# Patient Record
Sex: Female | Born: 1970
Health system: Southern US, Community
[De-identification: ages and names within clinical notes are randomized; demographics above are authoritative.]

## PROBLEM LIST (undated history)

## (undated) DIAGNOSIS — R400 Somnolence: Secondary | ICD-10-CM

## (undated) DIAGNOSIS — I272 Pulmonary hypertension, unspecified: Secondary | ICD-10-CM

## (undated) DIAGNOSIS — E669 Obesity, unspecified: Secondary | ICD-10-CM

## (undated) DIAGNOSIS — K219 Gastro-esophageal reflux disease without esophagitis: Secondary | ICD-10-CM

## (undated) DIAGNOSIS — J302 Other seasonal allergic rhinitis: Secondary | ICD-10-CM

## (undated) DIAGNOSIS — G4733 Obstructive sleep apnea (adult) (pediatric): Secondary | ICD-10-CM

## (undated) DIAGNOSIS — R5383 Other fatigue: Secondary | ICD-10-CM

## (undated) HISTORY — DX: Pulmonary hypertension, unspecified: I27.20

## (undated) HISTORY — DX: Gastro-esophageal reflux disease without esophagitis: K21.9

## (undated) HISTORY — PX: OVARIAN CYST SURGERY: SHX726

## (undated) HISTORY — DX: Somnolence: R40.0

## (undated) HISTORY — DX: Obesity, unspecified: E66.9

## (undated) HISTORY — DX: Other seasonal allergic rhinitis: J30.2

## (undated) HISTORY — DX: Obstructive sleep apnea (adult) (pediatric): G47.33

## (undated) HISTORY — DX: Other fatigue: R53.83

---

## 2001-01-27 ENCOUNTER — Emergency Department (HOSPITAL_COMMUNITY): Admission: EM | Admit: 2001-01-27 | Discharge: 2001-01-27 | Payer: Self-pay | Admitting: Emergency Medicine

## 2003-09-12 ENCOUNTER — Emergency Department (HOSPITAL_COMMUNITY): Admission: EM | Admit: 2003-09-12 | Discharge: 2003-09-12 | Payer: Self-pay | Admitting: Emergency Medicine

## 2008-11-22 ENCOUNTER — Encounter: Payer: Self-pay | Admitting: Internal Medicine

## 2008-12-28 ENCOUNTER — Ambulatory Visit: Payer: Self-pay | Admitting: Internal Medicine

## 2008-12-28 DIAGNOSIS — R1011 Right upper quadrant pain: Secondary | ICD-10-CM | POA: Insufficient documentation

## 2008-12-28 DIAGNOSIS — K219 Gastro-esophageal reflux disease without esophagitis: Secondary | ICD-10-CM | POA: Insufficient documentation

## 2008-12-28 DIAGNOSIS — R1319 Other dysphagia: Secondary | ICD-10-CM | POA: Insufficient documentation

## 2009-01-28 ENCOUNTER — Encounter: Payer: Self-pay | Admitting: Internal Medicine

## 2009-01-28 ENCOUNTER — Ambulatory Visit: Payer: Self-pay | Admitting: Internal Medicine

## 2009-01-28 HISTORY — PX: UPPER GASTROINTESTINAL ENDOSCOPY: SHX188

## 2009-03-14 ENCOUNTER — Telehealth: Payer: Self-pay | Admitting: Internal Medicine

## 2010-10-03 ENCOUNTER — Encounter: Payer: Self-pay | Admitting: Internal Medicine

## 2010-10-03 ENCOUNTER — Ambulatory Visit (INDEPENDENT_AMBULATORY_CARE_PROVIDER_SITE_OTHER): Payer: 59 | Admitting: Internal Medicine

## 2010-10-03 ENCOUNTER — Other Ambulatory Visit (INDEPENDENT_AMBULATORY_CARE_PROVIDER_SITE_OTHER): Payer: 59

## 2010-10-03 VITALS — BP 138/72 | HR 88 | Ht 65.5 in | Wt 207.8 lb

## 2010-10-03 DIAGNOSIS — R109 Unspecified abdominal pain: Secondary | ICD-10-CM

## 2010-10-03 DIAGNOSIS — R1010 Upper abdominal pain, unspecified: Secondary | ICD-10-CM

## 2010-10-03 DIAGNOSIS — R198 Other specified symptoms and signs involving the digestive system and abdomen: Secondary | ICD-10-CM

## 2010-10-03 LAB — CBC WITH DIFFERENTIAL/PLATELET
Basophils Absolute: 0.1 10*3/uL (ref 0.0–0.1)
Eosinophils Absolute: 0.1 10*3/uL (ref 0.0–0.7)
Lymphocytes Relative: 31 % (ref 12.0–46.0)
MCHC: 34.7 g/dL (ref 30.0–36.0)
Monocytes Relative: 8 % (ref 3.0–12.0)
Neutro Abs: 3.5 10*3/uL (ref 1.4–7.7)
Neutrophils Relative %: 57.9 % (ref 43.0–77.0)
Platelets: 247 10*3/uL (ref 150.0–400.0)
RDW: 12.7 % (ref 11.5–14.6)

## 2010-10-03 MED ORDER — ALIGN 4 MG PO CAPS: 1.0000 | ORAL_CAPSULE | Freq: Every day | ORAL | Status: AC

## 2010-10-03 NOTE — Patient Instructions (Addendum)
We are giving you samples of Align to take for 4 weeks You will follow up as needed unless you continue to have problems then you will need to contact the office Cc. Dr Konrad Felix

## 2010-10-03 NOTE — Progress Notes (Signed)
Subjective:    Patient ID: Brittany [name], female    DOB: [birth year], 40 y.o.   MRN: [id number]  HPI Comments: 40 yo white woman seen last in 2010. EGD then with minimal esophagitis, gastritis and duodenitis. In the early winter she noted some changes. Sudden intermittent cramps and diarrhea began a few months ago and a constant fullness and bloated sensation in upper abdomen. Then felt a lump in upper abdomen, now "just a hardness". Is tender in abdomen. Is concerned that there may be something serious wrong. Chronic intermittent nausea x years not changed. Bowel symptoms are less ut not gone. She is having irregular and alternating bowel habits. ? If dairy products bothering her. Has reduced but not eliminated dairy. Works third shift and does not sleep well overall. These symptoms do not bother her sleep.  No melena or passage of blood. + globus sensation but this is helped by eating small meals. She began Advil PM and also uses advil regularly. She stopped 2 months ago after a year of that but no changes.  Gastrophageal Reflux She complains of abdominal pain.  Abdominal Pain Her past medical history is significant for GERD.      Review of Systems  Gastrointestinal: Positive for abdominal pain.  Sleeping problems. Denies urinary problems, menses regular      Objective:   Physical Exam  Constitutional: She is oriented to person, place, and time. She appears well-developed and well-nourished.       Overweight  Eyes: Conjunctivae are normal. No scleral icterus.  Cardiovascular: Normal rate, regular rhythm and normal heart sounds.  Exam reveals no gallop.   Pulmonary/Chest: Effort normal and breath sounds normal. She has no wheezes. She has no rales.  Abdominal: Soft. Bowel sounds are normal. She exhibits no distension and no mass. There is tenderness. There is no rebound and no guarding.       Mild diffuse tenderness The linea alba is palpable in the midline and this is demonstrated to  be a hard areas she palpated, it is more prominent with muscle tension during cough or flexion of the abdominal wall No hernias or no hepatosplenomegaly or mass  Neurological: She is alert and oriented to person, place, and time.  Psychiatric: She has a normal mood and affect.          Assessment & Plan:  #1 upper abdominal pain more so than lower this seems like an irritable bowel phenomenon more than anything to me. Her abdominal exam is benign and the firm areas appear to be connected tissue and the abdominal wall. We discussed this and we'll order lab workup with CBC, cemented TSH and sedimentation rate. We'll forward that to Dr. Konrad Felix when complete. Unless that shows anything different she will complete a trial of Align therapy for one month which will hopefully help her altered bowel habits as well.  #2 Altered bowel habits with alternating loose urgent stools and some constipation this is most consistent with IBS in my opinion she will try the align it may need to add some fiber depending upon what is seen in how she responds  #3 globus sensation and some GERD symptoms, these are stable previous upper endoscopy was unrevealing  She'll followup as needed and workup will be directed based upon persistent or new symptoms or signs and if the labs are abnormal. We talked about a more aggressive evaluation at this time but things seem to be improving overall and she was reassured today but knows to  return or call back if things fail to improve.

## 2010-10-04 ENCOUNTER — Telehealth: Payer: Self-pay

## 2010-10-04 LAB — COMPREHENSIVE METABOLIC PANEL
ALT: 20 U/L (ref 0–35)
AST: 19 U/L (ref 0–37)
CO2: 25 mEq/L (ref 19–32)
Calcium: 9 mg/dL (ref 8.4–10.5)
Chloride: 106 mEq/L (ref 96–112)
Creatinine, Ser: 0.7 mg/dL (ref 0.4–1.2)
GFR: 99.61 mL/min (ref >60.00)
Potassium: 4.9 mEq/L (ref 3.5–5.1)
Sodium: 140 mEq/L (ref 135–145)
Total Protein: 6.7 g/dL (ref 6.0–8.3)

## 2010-10-04 LAB — TSH: TSH: 3.73 u[IU]/mL (ref 0.35–5.50)

## 2010-10-04 NOTE — Telephone Encounter (Signed)
Spoke with pt and she is aware of labs per Dr. Leone Payor and his recommendation. Labs faxed to Dr. Konrad Felix

## 2010-10-04 NOTE — Telephone Encounter (Signed)
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 Konrad Felix - her PCP. I was unable to route/fax it in Epic

## 2010-10-04 NOTE — Progress Notes (Signed)
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 Konrad Felix - her PCP. I was unable to route/fax it in Epic ______

## 2013-04-17 ENCOUNTER — Emergency Department (HOSPITAL_COMMUNITY)
Admission: EM | Admit: 2013-04-17 | Discharge: 2013-04-17 | Disposition: A | Discharge status: Home / Self Care | Payer: No Typology Code available for payment source | Attending: Emergency Medicine | Admitting: Emergency Medicine

## 2013-04-17 ENCOUNTER — Encounter (HOSPITAL_COMMUNITY): Payer: Self-pay | Admitting: Emergency Medicine

## 2013-04-17 DIAGNOSIS — Y9389 Activity, other specified: Secondary | ICD-10-CM | POA: Insufficient documentation

## 2013-04-17 DIAGNOSIS — J309 Allergic rhinitis, unspecified: Secondary | ICD-10-CM | POA: Insufficient documentation

## 2013-04-17 DIAGNOSIS — K219 Gastro-esophageal reflux disease without esophagitis: Secondary | ICD-10-CM | POA: Insufficient documentation

## 2013-04-17 DIAGNOSIS — Z79899 Other long term (current) drug therapy: Secondary | ICD-10-CM | POA: Insufficient documentation

## 2013-04-17 DIAGNOSIS — Y92410 Unspecified street and highway as the place of occurrence of the external cause: Secondary | ICD-10-CM | POA: Insufficient documentation

## 2013-04-17 DIAGNOSIS — M542 Cervicalgia: Secondary | ICD-10-CM

## 2013-04-17 MED ORDER — IBUPROFEN 800 MG PO TABS: 800.0000 mg | ORAL_TABLET | Freq: Three times a day (TID) | ORAL | Status: DC

## 2013-04-17 MED ORDER — DIAZEPAM 5 MG PO TABS: 5.0000 mg | ORAL_TABLET | Freq: Two times a day (BID) | ORAL | Status: DC

## 2013-04-17 NOTE — ED Notes (Signed)
PA at bedside for evaluation

## 2013-04-17 NOTE — ED Provider Notes (Signed)
Medical screening examination/treatment/procedure(s) were performed by non-physician practitioner and as supervising physician I was immediately available for consultation/collaboration.   Darleene Cumpian, MD 04/17/13 1648 
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
## 2013-05-18 ENCOUNTER — Emergency Department (HOSPITAL_COMMUNITY)
Admission: EM | Admit: 2013-05-18 | Discharge: 2013-05-18 | Disposition: A | Discharge status: Home / Self Care | Payer: No Typology Code available for payment source | Attending: Emergency Medicine | Admitting: Emergency Medicine

## 2013-05-18 ENCOUNTER — Emergency Department (HOSPITAL_COMMUNITY): Payer: No Typology Code available for payment source

## 2013-05-18 ENCOUNTER — Encounter (HOSPITAL_COMMUNITY): Payer: Self-pay | Admitting: Emergency Medicine

## 2013-05-18 DIAGNOSIS — S0003XA Contusion of scalp, initial encounter: Secondary | ICD-10-CM | POA: Insufficient documentation

## 2013-05-18 DIAGNOSIS — M545 Low back pain, unspecified: Secondary | ICD-10-CM | POA: Insufficient documentation

## 2013-05-18 DIAGNOSIS — Y92410 Unspecified street and highway as the place of occurrence of the external cause: Secondary | ICD-10-CM | POA: Insufficient documentation

## 2013-05-18 DIAGNOSIS — Y9389 Activity, other specified: Secondary | ICD-10-CM | POA: Insufficient documentation

## 2013-05-18 DIAGNOSIS — J309 Allergic rhinitis, unspecified: Secondary | ICD-10-CM | POA: Insufficient documentation

## 2013-05-18 DIAGNOSIS — M542 Cervicalgia: Secondary | ICD-10-CM | POA: Insufficient documentation

## 2013-05-18 DIAGNOSIS — K219 Gastro-esophageal reflux disease without esophagitis: Secondary | ICD-10-CM | POA: Insufficient documentation

## 2013-05-18 DIAGNOSIS — R1031 Right lower quadrant pain: Secondary | ICD-10-CM | POA: Insufficient documentation

## 2013-05-18 DIAGNOSIS — Z79899 Other long term (current) drug therapy: Secondary | ICD-10-CM | POA: Insufficient documentation

## 2013-05-18 LAB — POCT I-STAT, CHEM 8
Calcium, Ion: 1.18 mmol/L (ref 1.12–1.23)
Creatinine, Ser: 0.8 mg/dL (ref 0.50–1.10)
Glucose, Bld: 107 mg/dL — ABNORMAL HIGH (ref 70–99)
Hemoglobin: 14.3 g/dL (ref 12.0–15.0)
Potassium: 3.9 mEq/L (ref 3.5–5.1)

## 2013-05-18 MED ORDER — CYCLOBENZAPRINE HCL 10 MG PO TABS
5.0000 mg | ORAL_TABLET | Freq: Once | ORAL | Status: AC
  Administered 2013-05-18: 5 mg via ORAL
  Filled 2013-05-18: qty 1

## 2013-05-18 MED ORDER — IBUPROFEN 400 MG PO TABS: 400.0000 mg | ORAL_TABLET | Freq: Four times a day (QID) | ORAL | Status: DC | PRN

## 2013-05-18 MED ORDER — HYDROCODONE-ACETAMINOPHEN 5-325 MG PO TABS: 2.0000 | ORAL_TABLET | ORAL | Status: DC | PRN

## 2013-05-18 MED ORDER — HYDROCODONE-ACETAMINOPHEN 5-325 MG PO TABS
1.0000 | ORAL_TABLET | Freq: Once | ORAL | Status: AC
  Administered 2013-05-18: 1 via ORAL
  Filled 2013-05-18: qty 1

## 2013-05-18 MED ORDER — DIAZEPAM 5 MG PO TABS: 5.0000 mg | ORAL_TABLET | Freq: Four times a day (QID) | ORAL | Status: DC | PRN

## 2013-05-18 MED ORDER — IOHEXOL 300 MG/ML  SOLN
100.0000 mL | Freq: Once | INTRAMUSCULAR | Status: AC | PRN
  Administered 2013-05-18: 100 mL via INTRAVENOUS
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
## 2013-05-19 NOTE — ED Provider Notes (Signed)
Medical screening examination/treatment/procedure(s) were performed by non-physician practitioner and as supervising physician I was immediately available for consultation/collaboration.  EKG Interpretation   None        Teddrick Mallari R. Ayame Rena, MD 05/19/13 0658 

## 2013-08-14 ENCOUNTER — Emergency Department (HOSPITAL_COMMUNITY)
Admission: EM | Admit: 2013-08-14 | Discharge: 2013-08-14 | Disposition: A | Discharge status: Home / Self Care | Payer: 59 | Source: Home / Self Care | Attending: Family Medicine | Admitting: Family Medicine

## 2013-08-14 ENCOUNTER — Encounter (HOSPITAL_COMMUNITY): Payer: Self-pay | Admitting: Emergency Medicine

## 2013-08-14 DIAGNOSIS — L738 Other specified follicular disorders: Secondary | ICD-10-CM

## 2013-08-14 DIAGNOSIS — L739 Follicular disorder, unspecified: Secondary | ICD-10-CM

## 2013-08-14 MED ORDER — FLUCONAZOLE 150 MG PO TABS: 150.0000 mg | ORAL_TABLET | Freq: Once | ORAL | Status: DC

## 2013-08-14 MED ORDER — DOXYCYCLINE HYCLATE 100 MG PO CAPS: 100.0000 mg | ORAL_CAPSULE | Freq: Two times a day (BID) | ORAL | Status: DC

## 2013-08-14 NOTE — Discharge Instructions (Signed)
Thank you for coming in today. Take doxycycline twice daily for 7-10 days.  Use fluconazole if you develop a yeast infection.  Return if not getting better or if the central portion becomes soft and squishy and very tender.   Folliculitis  Folliculitis is redness, soreness, and swelling (inflammation) of the hair follicles. This condition can occur anywhere on the body. People with weakened immune systems, diabetes, or obesity have a greater risk of getting folliculitis. CAUSES  Bacterial infection. This is the most common cause.  Fungal infection.  Viral infection.  Contact with certain chemicals, especially oils and tars. Long-term folliculitis can result from bacteria that live in the nostrils. The bacteria may trigger multiple outbreaks of folliculitis over time. SYMPTOMS Folliculitis most commonly occurs on the scalp, thighs, legs, back, buttocks, and areas where hair is shaved frequently. An early sign of folliculitis is a small, white or yellow, pus-filled, itchy lesion (pustule). These lesions appear on a red, inflamed follicle. They are usually less than 0.2 inches (5 mm) wide. When there is an infection of the follicle that goes deeper, it becomes a boil or furuncle. A group of closely packed boils creates a larger lesion (carbuncle). Carbuncles tend to occur in hairy, sweaty areas of the body. DIAGNOSIS  Your caregiver can usually tell what is wrong by doing a physical exam. A sample may be taken from one of the lesions and tested in a lab. This can help determine what is causing your folliculitis. TREATMENT  Treatment may include:  Applying warm compresses to the affected areas.  Taking antibiotic medicines orally or applying them to the skin.  Draining the lesions if they contain a large amount of pus or fluid.  Laser hair removal for cases of long-lasting folliculitis. This helps to prevent regrowth of the hair. HOME CARE INSTRUCTIONS  Apply warm compresses to the  affected areas as directed by your caregiver.  If antibiotics are prescribed, take them as directed. Finish them even if you start to feel better.  You may take over-the-counter medicines to relieve itching.  Do not shave irritated skin.  Follow up with your caregiver as directed. SEEK IMMEDIATE MEDICAL CARE IF:   You have increasing redness, swelling, or pain in the affected area.  You have a fever. MAKE SURE YOU:  Understand these instructions.  Will watch your condition.  Will get help right away if you are not doing well or get worse. Document Released: 08/27/2001 Document Revised: 12/18/2011 Document Reviewed: 09/18/2011 [facility] Patient Information 2014 [place], [place].  Abscess An abscess is an infected area that contains a collection of pus and debris.It can occur in almost any part of the body. An abscess is also known as a furuncle or boil. CAUSES  An abscess occurs when tissue gets infected. This can occur from blockage of oil or sweat glands, infection of hair follicles, or a minor injury to the skin. As the body tries to fight the infection, pus collects in the area and creates pressure under the skin. This pressure causes pain. People with weakened immune systems have difficulty fighting infections and get certain abscesses more often.  SYMPTOMS Usually an abscess develops on the skin and becomes a painful mass that is red, warm, and tender. If the abscess forms under the skin, you may feel a moveable soft area under the skin. Some abscesses break open (rupture) on their own, but most will continue to get worse without care. The infection can spread deeper into the body and eventually into  the bloodstream, causing you to feel ill.  DIAGNOSIS  Your caregiver will take your medical history and perform a physical exam. A sample of fluid may also be taken from the abscess to determine what is causing your infection. TREATMENT  Your caregiver may prescribe antibiotic  medicines to fight the infection. However, taking antibiotics alone usually does not cure an abscess. Your caregiver may need to make a small cut (incision) in the abscess to drain the pus. In some cases, gauze is packed into the abscess to reduce pain and to continue draining the area. HOME CARE INSTRUCTIONS   Only take over-the-counter or prescription medicines for pain, discomfort, or fever as directed by your caregiver.  If you were prescribed antibiotics, take them as directed. Finish them even if you start to feel better.  If gauze is used, follow your caregiver's directions for changing the gauze.  To avoid spreading the infection:  Keep your draining abscess covered with a bandage.  Wash your hands well.  Do not share personal care items, towels, or whirlpools with others.  Avoid skin contact with others.  Keep your skin and clothes clean around the abscess.  Keep all follow-up appointments as directed by your caregiver. SEEK MEDICAL CARE IF:   You have increased pain, swelling, redness, fluid drainage, or bleeding.  You have muscle aches, chills, or a general ill feeling.  You have a fever. MAKE SURE YOU:   Understand these instructions.  Will watch your condition.  Will get help right away if you are not doing well or get worse. Document Released: 03/28/2005 Document Revised: 12/18/2011 Document Reviewed: 08/31/2011 [facility] Patient Information 2014 [place], [place].

## 2013-08-14 NOTE — ED Provider Notes (Signed)
Brittany [name] is a 43 y.o. female who presents to Urgent Care today for spider bite. Patient has developed a painful erythematous papule on her right superior anterior chest wall. She cannot recall any actual bug bite. It has become red and tender. She has not tried any medications. No fevers chills nausea vomiting or diarrhea. She notes that she typically gets yeast infections following antibiotics. Feels well otherwise.   Past Medical History  Diagnosis Date  . GERD (gastroesophageal reflux disease)   . Seasonal allergies    History  Substance Use Topics  . Smoking status: Never Smoker   . Smokeless tobacco: Never Used  . Alcohol Use: Yes     Comment: wine 1 glass ervey 2 months   ROS as above Medications: No current facility-administered medications for this encounter.   Current Outpatient Prescriptions  Medication Sig Dispense Refill  . cetirizine (ZYRTEC) 10 MG tablet Take 10 mg by mouth at bedtime.       . diazepam (VALIUM) 5 MG tablet Take 1 tablet (5 mg total) by mouth every 6 (six) hours as needed for anxiety.  20 tablet  0  . doxycycline (VIBRAMYCIN) 100 MG capsule Take 1 capsule (100 mg total) by mouth 2 (two) times daily.  20 capsule  0  . esomeprazole (NEXIUM) 20 MG capsule Take 20 mg by mouth every morning.      . fexofenadine (ALLEGRA) 180 MG tablet Take 180 mg by mouth daily as needed for allergies.       . fluconazole (DIFLUCAN) 150 MG tablet Take 1 tablet (150 mg total) by mouth once.  1 tablet  1  . GLUCOSAMINE-CHONDROITIN PO Take 1 tablet by mouth every morning.      Marland Kitchen. HYDROcodone-acetaminophen (NORCO/VICODIN) 5-325 MG per tablet Take 2 tablets by mouth every 4 (four) hours as needed.  25 tablet  0  . ibuprofen (ADVIL,MOTRIN) 400 MG tablet Take 1 tablet (400 mg total) by mouth every 6 (six) hours as needed.  30 tablet  0  . norethindrone-ethinyl estradiol (ORTHO-NOVUM 1-35 TAB,NORTREL 1-35 TAB) 1-35 MG-MCG per tablet Take 1 tablet by mouth every morning.          Exam:  BP 132/92  Pulse 108  Temp(Src) 98.7 F (37.1 C) (Oral)  Resp 18  SpO2 99%  LMP 07/24/2013  Gen: Well NAD SKIN: Erythematous indurated papule right anterior superior chest wall. Tender to touch. Total diameter of erythema about 1.5 cm.    Assessment and Plan: 43 y.o. female with folliculitis. Not yet fluctuant.  Plan to treat with oral antibiotics. Will use doxycycline. Prescribe fluconazole in case patient develops yeast infection.  Discussed warning signs or symptoms. Please see discharge instructions. Patient expresses understanding.    Rodolph BongEvan S Amelia Burgard, MD 08/14/13 [phone]

## 2013-08-14 NOTE — ED Notes (Signed)
See physicians note  Pt c/o poss insect bite to right shoulder onset 3 days Sxs include pain and tenderness Alert w/no signs of acute distress.

## 2014-11-15 ENCOUNTER — Emergency Department (INDEPENDENT_AMBULATORY_CARE_PROVIDER_SITE_OTHER)
Admission: EM | Admit: 2014-11-15 | Discharge: 2014-11-15 | Disposition: A | Discharge status: Home / Self Care | Payer: 59 | Source: Home / Self Care | Attending: Family Medicine | Admitting: Family Medicine

## 2014-11-15 DIAGNOSIS — J40 Bronchitis, not specified as acute or chronic: Secondary | ICD-10-CM | POA: Diagnosis not present

## 2014-11-15 MED ORDER — PREDNISONE 10 MG PO TABS: 30.0000 mg | ORAL_TABLET | Freq: Every day | ORAL | Status: DC

## 2014-11-15 MED ORDER — AZITHROMYCIN 250 MG PO TABS: 250.0000 mg | ORAL_TABLET | Freq: Every day | ORAL | Status: DC

## 2014-11-15 MED ORDER — HYDROCOD POLST-CPM POLST ER 10-8 MG/5ML PO SUER: 5.0000 mL | Freq: Two times a day (BID) | ORAL | Status: DC | PRN

## 2014-11-15 NOTE — Discharge Instructions (Signed)
Thank you for coming in today. °Call or go to the emergency room if you get worse, have trouble breathing, have chest pains, or palpitations.  ° °Acute Bronchitis °Bronchitis is inflammation of the airways that extend from the windpipe into the lungs (bronchi). The inflammation often causes mucus to develop. This leads to a cough, which is the most common symptom of bronchitis.  °In acute bronchitis, the condition usually develops suddenly and goes away over time, usually in a couple weeks. Smoking, allergies, and asthma can make bronchitis worse. Repeated episodes of bronchitis may cause further lung problems.  °CAUSES °Acute bronchitis is most often caused by the same virus that causes a cold. The virus can spread from person to person (contagious) through coughing, sneezing, and touching contaminated objects. °SIGNS AND SYMPTOMS  °· Cough.   °· Fever.   °· Coughing up mucus.   °· Body aches.   °· Chest congestion.   °· Chills.   °· Shortness of breath.   °· Sore throat.   °DIAGNOSIS  °Acute bronchitis is usually diagnosed through a physical exam. Your health care provider will also ask you questions about your medical history. Tests, such as chest X-rays, are sometimes done to rule out other conditions.  °TREATMENT  °Acute bronchitis usually goes away in a couple weeks. Oftentimes, no medical treatment is necessary. Medicines are sometimes given for relief of fever or cough. Antibiotic medicines are usually not needed but may be prescribed in certain situations. In some cases, an inhaler may be recommended to help reduce shortness of breath and control the cough. A cool mist vaporizer may also be used to help thin bronchial secretions and make it easier to clear the chest.  °HOME CARE INSTRUCTIONS °· Get plenty of rest.   °· Drink enough fluids to keep your urine clear or pale yellow (unless you have a medical condition that requires fluid restriction). Increasing fluids may help thin your respiratory secretions  (sputum) and reduce chest congestion, and it will prevent dehydration.   °· Take medicines only as directed by your health care provider. °· If you were prescribed an antibiotic medicine, finish it all even if you start to feel better. °· Avoid smoking and secondhand smoke. Exposure to cigarette smoke or irritating chemicals will make bronchitis worse. If you are a smoker, consider using nicotine gum or skin patches to help control withdrawal symptoms. Quitting smoking will help your lungs heal faster.   °· Reduce the chances of another bout of acute bronchitis by washing your hands frequently, avoiding people with cold symptoms, and trying not to touch your hands to your mouth, nose, or eyes.   °· Keep all follow-up visits as directed by your health care provider.   °SEEK MEDICAL CARE IF: °Your symptoms do not improve after 1 week of treatment.  °SEEK IMMEDIATE MEDICAL CARE IF: °· You develop an increased fever or chills.   °· You have chest pain.   °· You have severe shortness of breath. °· You have bloody sputum.   °· You develop dehydration. °· You faint or repeatedly feel like you are going to pass out. °· You develop repeated vomiting. °· You develop a severe headache. °MAKE SURE YOU:  °· Understand these instructions. °· Will watch your condition. °· Will get help right away if you are not doing well or get worse. °Document Released: 07/26/2004 Document Revised: 11/02/2013 Document Reviewed: 12/09/2012 °Exit[employer]® Patient Information ©2015 [employer]. This information is not intended to replace advice given to you by your health care provider. Make sure you discuss any questions you have with your   health care provider. ° °

## 2014-11-15 NOTE — ED Notes (Signed)
C/o cough States she has drainage in her throat States she has a productive cough with yellow mucous Patient is nausea Cold meds used as tx

## 2014-11-15 NOTE — ED Provider Notes (Signed)
Brittany [name] is a 44 y.o. female who presents to Urgent Care today for Cough postnasal drip ear pressure. Symptoms present for several days. Symptoms are consistent with prior episodes of bronchitis. No fevers or chills vomiting or diarrhea. She took equal today which helped a little. She's tried some Flonase in the past but has trouble tolerating nasal sprays. She's had this multiple times in the past and done well with antibiotics.   Past Medical History  Diagnosis Date  . GERD (gastroesophageal reflux disease)   . Seasonal allergies    Past Surgical History  Procedure Laterality Date  . Ovarian cyst surgery    . Upper gastrointestinal endoscopy  01/28/2009    minimal esophagitis - GERD, chronic peptic duodenitis, minimal gastritis   History  Substance Use Topics  . Smoking status: Never Smoker   . Smokeless tobacco: Never Used  . Alcohol Use: Yes     Comment: wine 1 glass ervey 2 months   ROS as above Medications: No current facility-administered medications for this encounter.   Current Outpatient Prescriptions  Medication Sig Dispense Refill  . azithromycin (ZITHROMAX) 250 MG tablet Take 1 tablet (250 mg total) by mouth daily. Take first 2 tablets together, then 1 every day until finished. 6 tablet 0  . cetirizine (ZYRTEC) 10 MG tablet Take 10 mg by mouth at bedtime.     . chlorpheniramine-HYDROcodone (TUSSIONEX PENNKINETIC ER) 10-8 MG/5ML SUER Take 5 mLs by mouth every 12 (twelve) hours as needed for cough. 140 mL 0  . diazepam (VALIUM) 5 MG tablet Take 1 tablet (5 mg total) by mouth every 6 (six) hours as needed for anxiety. 20 tablet 0  . doxycycline (VIBRAMYCIN) 100 MG capsule Take 1 capsule (100 mg total) by mouth 2 (two) times daily. 20 capsule 0  . esomeprazole (NEXIUM) 20 MG capsule Take 20 mg by mouth every morning.    . fexofenadine (ALLEGRA) 180 MG tablet Take 180 mg by mouth daily as needed for allergies.     . fluconazole (DIFLUCAN) 150 MG tablet Take 1 tablet  (150 mg total) by mouth once. 1 tablet 1  . GLUCOSAMINE-CHONDROITIN PO Take 1 tablet by mouth every morning.    Marland Kitchen. ibuprofen (ADVIL,MOTRIN) 400 MG tablet Take 1 tablet (400 mg total) by mouth every 6 (six) hours as needed. 30 tablet 0  . norethindrone-ethinyl estradiol (ORTHO-NOVUM 1-35 TAB,NORTREL 1-35 TAB) 1-35 MG-MCG per tablet Take 1 tablet by mouth every morning.     . predniSONE (DELTASONE) 10 MG tablet Take 3 tablets (30 mg total) by mouth daily. 15 tablet 0   No Known Allergies   Exam:  BP 149/76 mmHg  Pulse 117  Temp(Src) 99.4 F (37.4 C) (Oral)  Resp 16  SpO2 98%  LMP 11/09/2014 (Within Weeks) Gen: Well NAD HEENT: EOMI,  MMM posterior pharynx with cobblestoning normal tympanic membranes bilaterally Lungs: Normal work of breathing. CTABL Heart: tachycardia no MRG Abd: NABS, Soft. Nondistended, Nontender Exts: Brisk capillary refill, warm and well perfused.   No results found for this or any previous visit (from the past 24 hour(s)). No results found.  Assessment and Plan: 44 y.o. female with bronchitis with postnasal drip. Treat with prednisone and Tussionex. He is azithromycin if not better. Tachycardia related to DayQuil. Return as needed  Discussed warning signs or symptoms. Please see discharge instructions. Patient expresses understanding.     Rodolph BongEvan S Atia Haupt, MD 11/15/14 [phone]

## 2014-11-16 ENCOUNTER — Encounter: Payer: Self-pay | Admitting: Internal Medicine
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
## 2015-07-05 MED FILL — VENLAFAXINE HCL ER 75 MG CA: 75 | 30 days supply | Qty: 30 | Fill #0

## 2015-07-21 DIAGNOSIS — G2581 Restless legs syndrome: Secondary | ICD-10-CM | POA: Diagnosis not present

## 2015-07-21 DIAGNOSIS — R413 Other amnesia: Secondary | ICD-10-CM | POA: Diagnosis not present

## 2015-07-21 DIAGNOSIS — G44209 Tension-type headache, unspecified, not intractable: Secondary | ICD-10-CM | POA: Diagnosis not present

## 2015-07-21 DIAGNOSIS — K210 Gastro-esophageal reflux disease with esophagitis: Secondary | ICD-10-CM | POA: Diagnosis not present

## 2015-07-21 DIAGNOSIS — F5101 Primary insomnia: Secondary | ICD-10-CM | POA: Diagnosis not present

## 2015-07-21 DIAGNOSIS — F3342 Major depressive disorder, recurrent, in full remission: Secondary | ICD-10-CM | POA: Diagnosis not present

## 2015-07-21 MED FILL — AMITRIPTYLINE HCL 50 MG TAB: 50 | 30 days supply | Qty: 30 | Fill #0

## 2015-07-21 MED FILL — ESOMEPRAZOLE MAG DR 40 MG C: 40 | 30 days supply | Qty: 30 | Fill #0

## 2015-08-04 MED FILL — VENLAFAXINE HCL ER 75 MG CA: 75 | 30 days supply | Qty: 30 | Fill #0

## 2015-08-22 MED FILL — ESOMEPRAZOLE MAG DR 40 MG C: 40 | 30 days supply | Qty: 30 | Fill #1

## 2015-09-05 MED FILL — VENLAFAXINE HCL ER 75 MG CA: 75 | 30 days supply | Qty: 30 | Fill #1

## 2015-09-14 MED FILL — AMITRIPTYLINE HCL 50 MG TAB: 50 | 30 days supply | Qty: 30 | Fill #1

## 2015-09-21 MED FILL — ESOMEPRAZOLE MAG DR 40 MG C: 40 | 30 days supply | Qty: 30 | Fill #2

## 2015-10-07 MED FILL — VENLAFAXINE HCL ER 75 MG CA: 75 | 30 days supply | Qty: 30 | Fill #2

## 2015-10-08 DIAGNOSIS — R05 Cough: Secondary | ICD-10-CM | POA: Diagnosis not present

## 2015-10-08 DIAGNOSIS — J019 Acute sinusitis, unspecified: Secondary | ICD-10-CM | POA: Diagnosis not present

## 2015-10-12 DIAGNOSIS — Z029 Encounter for administrative examinations, unspecified: Secondary | ICD-10-CM | POA: Diagnosis not present

## 2015-10-18 MED FILL — AMITRIPTYLINE HCL 50 MG TAB: 50 | 30 days supply | Qty: 30 | Fill #2

## 2015-11-07 MED FILL — ESOMEPRAZOLE MAG DR 40 MG C: 40 | 30 days supply | Qty: 30 | Fill #3

## 2015-11-07 MED FILL — VENLAFAXINE HCL ER 75 MG CA: 75 | 30 days supply | Qty: 30 | Fill #3

## 2015-11-25 MED FILL — AMITRIPTYLINE HCL 50 MG TAB: 50 | 30 days supply | Qty: 30 | Fill #3

## 2015-12-06 MED FILL — ESOMEPRAZOLE MAG DR 40 MG C: 40 | 30 days supply | Qty: 30 | Fill #4

## 2015-12-06 MED FILL — VENLAFAXINE HCL ER 75 MG CA: 75 | 30 days supply | Qty: 30 | Fill #4

## 2015-12-09 DIAGNOSIS — K219 Gastro-esophageal reflux disease without esophagitis: Secondary | ICD-10-CM | POA: Diagnosis not present

## 2015-12-09 DIAGNOSIS — J309 Allergic rhinitis, unspecified: Secondary | ICD-10-CM | POA: Diagnosis not present

## 2015-12-09 DIAGNOSIS — H1045 Other chronic allergic conjunctivitis: Secondary | ICD-10-CM | POA: Diagnosis not present

## 2015-12-09 DIAGNOSIS — R05 Cough: Secondary | ICD-10-CM | POA: Diagnosis not present

## 2015-12-09 MED FILL — AZELASTINE HCL 0.05% DROPS: 0.05 | 24 days supply | Qty: 6 | Fill #0

## 2015-12-09 MED FILL — LEVOCETIRIZINE 5 MG TABLET: 5 | 30 days supply | Qty: 30 | Fill #0

## 2015-12-09 MED FILL — BREO ELLIPTA 100-25 MCG INH: 100-25 | 30 days supply | Qty: 60 | Fill #0

## 2015-12-09 MED FILL — MONTELUKAST SOD 10 MG TAB: 10 | 30 days supply | Qty: 30 | Fill #0

## 2015-12-09 MED FILL — PROAIR RESPICLICK INHAL PWD: 108 (90 BAS | 25 days supply | Qty: 1 | Fill #0

## 2015-12-09 MED FILL — DYMISTA NASAL SPRAY: 137-50 | 30 days supply | Qty: 23 | Fill #0

## 2015-12-26 MED FILL — AMITRIPTYLINE HCL 50 MG TAB: 50 | 30 days supply | Qty: 30 | Fill #4

## 2016-01-04 MED FILL — MONTELUKAST SOD 10 MG TAB: 10 | 30 days supply | Qty: 30 | Fill #1

## 2016-01-04 MED FILL — ESOMEPRAZOLE MAG DR 40 MG C: 40 | 30 days supply | Qty: 30 | Fill #5

## 2016-01-04 MED FILL — VENLAFAXINE HCL ER 75 MG CA: 75 | 30 days supply | Qty: 30 | Fill #5

## 2016-01-16 MED FILL — LEVOCETIRIZINE 5 MG TABLET: 5 | 30 days supply | Qty: 30 | Fill #1

## 2016-01-23 MED FILL — AMITRIPTYLINE HCL 50 MG TAB: 50 | 30 days supply | Qty: 30 | Fill #5

## 2016-01-25 MED FILL — BREO ELLIPTA 100-25 MCG INH: 100-25 | 30 days supply | Qty: 60 | Fill #1

## 2016-02-01 DIAGNOSIS — Z01419 Encounter for gynecological examination (general) (routine) without abnormal findings: Secondary | ICD-10-CM | POA: Diagnosis not present

## 2016-02-09 MED FILL — VENLAFAXINE HCL ER 75 MG CA: 75 | 30 days supply | Qty: 30 | Fill #6

## 2016-02-09 MED FILL — MONTELUKAST SOD 10 MG TAB: 10 | 30 days supply | Qty: 30 | Fill #2

## 2016-02-09 MED FILL — AZELASTINE HCL 0.05% DROPS: 0.05 | 24 days supply | Qty: 6 | Fill #1

## 2016-02-09 MED FILL — ESOMEPRAZOLE MAG DR 40 MG C: 40 | 30 days supply | Qty: 30 | Fill #6

## 2016-02-09 MED FILL — LEVOCETIRIZINE 5 MG TABLET: 5 | 30 days supply | Qty: 30 | Fill #2

## 2016-02-14 MED FILL — BREO ELLIPTA 100-25 MCG INH: 100-25 | 30 days supply | Qty: 60 | Fill #2

## 2016-02-14 MED FILL — AMITRIPTYLINE HCL 50 MG TAB: 50 | 30 days supply | Qty: 30 | Fill #6

## 2016-03-06 MED FILL — ESOMEPRAZOLE MAG DR 40 MG C: 40 | 30 days supply | Qty: 30 | Fill #7

## 2016-03-06 MED FILL — LEVOCETIRIZINE 5 MG TABLET: 5 | 30 days supply | Qty: 30 | Fill #3

## 2016-03-06 MED FILL — MONTELUKAST SOD 10 MG TAB: 10 | 30 days supply | Qty: 30 | Fill #3

## 2016-03-06 MED FILL — VENLAFAXINE HCL ER 75 MG CA: 75 | 30 days supply | Qty: 30 | Fill #7

## 2016-03-26 DIAGNOSIS — J209 Acute bronchitis, unspecified: Secondary | ICD-10-CM | POA: Diagnosis not present

## 2016-03-26 DIAGNOSIS — J0100 Acute maxillary sinusitis, unspecified: Secondary | ICD-10-CM | POA: Diagnosis not present

## 2016-03-26 DIAGNOSIS — J040 Acute laryngitis: Secondary | ICD-10-CM | POA: Diagnosis not present

## 2016-03-30 MED FILL — VENLAFAXINE HCL ER 75 MG CA: 75 | 30 days supply | Qty: 30 | Fill #8

## 2016-03-30 MED FILL — AMITRIPTYLINE HCL 50 MG TAB: 50 | 30 days supply | Qty: 30 | Fill #7

## 2016-03-30 MED FILL — MONTELUKAST SOD 10 MG TAB: 10 | 30 days supply | Qty: 30 | Fill #0

## 2016-03-30 MED FILL — ESOMEPRAZOLE MAG DR 40 MG C: 40 | 30 days supply | Qty: 30 | Fill #8

## 2016-03-30 MED FILL — LEVOCETIRIZINE 5 MG TABLET: 5 | 30 days supply | Qty: 30 | Fill #0

## 2016-04-02 MED FILL — DYMISTA NASAL SPRAY: 137-50 | 30 days supply | Qty: 23 | Fill #1

## 2016-04-09 MED FILL — BREO ELLIPTA 100-25 MCG INH: 100-25 | 30 days supply | Qty: 60 | Fill #3

## 2016-04-24 ENCOUNTER — Ambulatory Visit
Admission: RE | Admit: 2016-04-24 | Discharge: 2016-04-24 | Disposition: A | Discharge status: Home / Self Care | Payer: 59 | Source: Ambulatory Visit | Attending: Allergy | Admitting: Allergy

## 2016-04-24 ENCOUNTER — Other Ambulatory Visit: Payer: Self-pay | Admitting: Allergy

## 2016-04-24 DIAGNOSIS — R05 Cough: Secondary | ICD-10-CM | POA: Diagnosis not present

## 2016-04-24 DIAGNOSIS — R059 Cough, unspecified: Secondary | ICD-10-CM

## 2016-04-30 DIAGNOSIS — R600 Localized edema: Secondary | ICD-10-CM | POA: Diagnosis not present

## 2016-04-30 DIAGNOSIS — K219 Gastro-esophageal reflux disease without esophagitis: Secondary | ICD-10-CM | POA: Diagnosis not present

## 2016-04-30 DIAGNOSIS — E782 Mixed hyperlipidemia: Secondary | ICD-10-CM | POA: Diagnosis not present

## 2016-04-30 DIAGNOSIS — R000 Tachycardia, unspecified: Secondary | ICD-10-CM | POA: Diagnosis not present

## 2016-04-30 DIAGNOSIS — J810 Acute pulmonary edema: Secondary | ICD-10-CM | POA: Diagnosis not present

## 2016-04-30 DIAGNOSIS — J209 Acute bronchitis, unspecified: Secondary | ICD-10-CM | POA: Diagnosis not present

## 2016-04-30 DIAGNOSIS — I10 Essential (primary) hypertension: Secondary | ICD-10-CM | POA: Diagnosis not present

## 2016-04-30 DIAGNOSIS — I517 Cardiomegaly: Secondary | ICD-10-CM | POA: Diagnosis not present

## 2016-04-30 MED FILL — FUROSEMIDE 20 MG TABLET: 20 | 30 days supply | Qty: 30 | Fill #0

## 2016-04-30 MED FILL — BENZONATATE 200 MG CAPSULE: 200 | 7 days supply | Qty: 20 | Fill #0

## 2016-04-30 MED FILL — METOPROLOL SUCC ER 25 MG TA: 25 | 30 days supply | Qty: 30 | Fill #0

## 2016-04-30 MED FILL — predniSONE 20 MG TABS: 20 | 9 days supply | Qty: 18 | Fill #0

## 2016-04-30 MED FILL — LISINOPRIL 5 MG TABLET: 5 | 30 days supply | Qty: 30 | Fill #0

## 2016-05-02 DIAGNOSIS — J342 Deviated nasal septum: Secondary | ICD-10-CM | POA: Diagnosis not present

## 2016-05-02 DIAGNOSIS — J343 Hypertrophy of nasal turbinates: Secondary | ICD-10-CM | POA: Diagnosis not present

## 2016-05-02 DIAGNOSIS — J310 Chronic rhinitis: Secondary | ICD-10-CM | POA: Diagnosis not present

## 2016-05-02 MED FILL — AMITRIPTYLINE HCL 50 MG TAB: 50 | 30 days supply | Qty: 30 | Fill #8

## 2016-05-02 MED FILL — DYMISTA NASAL SPRAY: 137-50 | 30 days supply | Qty: 23 | Fill #2

## 2016-05-02 MED FILL — ESOMEPRAZOLE MAG DR 40 MG C: 40 | 30 days supply | Qty: 30 | Fill #9

## 2016-05-02 MED FILL — VENLAFAXINE HCL ER 75 MG CA: 75 | 30 days supply | Qty: 30 | Fill #9

## 2016-05-02 MED FILL — MONTELUKAST SOD 10 MG TAB: 10 | 30 days supply | Qty: 30 | Fill #1

## 2016-05-09 ENCOUNTER — Other Ambulatory Visit (INDEPENDENT_AMBULATORY_CARE_PROVIDER_SITE_OTHER): Payer: Self-pay | Admitting: Otolaryngology

## 2016-05-09 DIAGNOSIS — J329 Chronic sinusitis, unspecified: Secondary | ICD-10-CM

## 2016-05-11 ENCOUNTER — Ambulatory Visit (HOSPITAL_COMMUNITY)
Admission: RE | Admit: 2016-05-11 | Discharge: 2016-05-11 | Disposition: A | Discharge status: Home / Self Care | Payer: 59 | Source: Ambulatory Visit | Attending: Otolaryngology | Admitting: Otolaryngology

## 2016-05-11 DIAGNOSIS — J329 Chronic sinusitis, unspecified: Secondary | ICD-10-CM | POA: Diagnosis not present

## 2016-05-11 DIAGNOSIS — J342 Deviated nasal septum: Secondary | ICD-10-CM | POA: Insufficient documentation

## 2016-05-21 MED FILL — FUROSEMIDE 40 MG TABLET: 40 | 90 days supply | Qty: 90 | Fill #0

## 2016-05-29 DIAGNOSIS — J342 Deviated nasal septum: Secondary | ICD-10-CM | POA: Diagnosis not present

## 2016-05-29 DIAGNOSIS — J310 Chronic rhinitis: Secondary | ICD-10-CM | POA: Diagnosis not present

## 2016-05-29 DIAGNOSIS — J343 Hypertrophy of nasal turbinates: Secondary | ICD-10-CM | POA: Diagnosis not present

## 2016-05-30 MED FILL — VENLAFAXINE HCL ER 75 MG CA: 75 | 30 days supply | Qty: 30 | Fill #10

## 2016-05-30 MED FILL — DYMISTA NASAL SPRAY: 137-50 | 30 days supply | Qty: 23 | Fill #3

## 2016-05-30 MED FILL — MONTELUKAST SOD 10 MG TAB: 10 | 30 days supply | Qty: 30 | Fill #2

## 2016-05-30 MED FILL — LISINOPRIL 5 MG TABLET: 5 | 30 days supply | Qty: 30 | Fill #1

## 2016-05-30 MED FILL — AMITRIPTYLINE HCL 50 MG TAB: 50 | 30 days supply | Qty: 30 | Fill #9

## 2016-05-30 MED FILL — ESOMEPRAZOLE MAG DR 40 MG C: 40 | 30 days supply | Qty: 30 | Fill #10

## 2016-05-30 MED FILL — METOPROLOL SUCC ER 25 MG TA: 25 | 30 days supply | Qty: 30 | Fill #1

## 2016-06-07 ENCOUNTER — Encounter: Payer: Self-pay | Admitting: *Deleted

## 2016-06-13 ENCOUNTER — Encounter: Payer: Self-pay | Admitting: Interventional Cardiology

## 2016-06-13 ENCOUNTER — Ambulatory Visit (INDEPENDENT_AMBULATORY_CARE_PROVIDER_SITE_OTHER): Payer: 59 | Admitting: Interventional Cardiology

## 2016-06-13 VITALS — BP 160/102 | HR 112 | Ht 65.5 in | Wt 294.0 lb

## 2016-06-13 DIAGNOSIS — R0683 Snoring: Secondary | ICD-10-CM

## 2016-06-13 DIAGNOSIS — R0609 Other forms of dyspnea: Secondary | ICD-10-CM | POA: Diagnosis not present

## 2016-06-13 DIAGNOSIS — I517 Cardiomegaly: Secondary | ICD-10-CM | POA: Diagnosis not present

## 2016-06-13 DIAGNOSIS — R0600 Dyspnea, unspecified: Secondary | ICD-10-CM

## 2016-06-13 DIAGNOSIS — R600 Localized edema: Secondary | ICD-10-CM

## 2016-06-13 LAB — CBC
HEMATOCRIT: 36.4 % (ref 35.0–45.0)
Hemoglobin: 11.6 g/dL — ABNORMAL LOW (ref 11.7–15.5)
MCH: 27 pg (ref 27.0–33.0)
MCHC: 31.9 g/dL — ABNORMAL LOW (ref 32.0–36.0)
MCV: 84.8 fL (ref 80.0–100.0)
MPV: 10.7 fL (ref 7.5–12.5)
Platelets: 364 10*3/uL (ref 140–400)
RBC: 4.29 MIL/uL (ref 3.80–5.10)
RDW: 14.2 % (ref 11.0–15.0)
WBC: 11.5 10*3/uL — ABNORMAL HIGH (ref 3.8–10.8)

## 2016-06-13 NOTE — Patient Instructions (Signed)
Medication Instructions:  None  Labwork: UA, CMET, CBC, TSH, and BNP today  Testing/Procedures: Your physician has requested that you have an echocardiogram. Echocardiography is a painless test that uses sound waves to create images of your heart. It provides your doctor with information about the size and shape of your heart and how well your heart's chambers and valves are working. This procedure takes approximately one hour. There are no restrictions for this procedure.  Your physician has recommended that you have a sleep study. This test records several body functions during sleep, including: brain activity, eye movement, oxygen and carbon dioxide blood levels, heart rate and rhythm, breathing rate and rhythm, the flow of air through your mouth and nose, snoring, body muscle movements, and chest and belly movement.   Follow-Up: Your physician recommends that you schedule a follow-up appointment for after your echo is completed.    Any Other Special Instructions Will Be Listed Below (If Applicable).     If you need a refill on your cardiac medications before your next appointment, please call your pharmacy.
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
## 2016-06-14 ENCOUNTER — Telehealth: Payer: Self-pay | Admitting: Interventional Cardiology

## 2016-06-14 LAB — URINALYSIS
BILIRUBIN URINE: NEGATIVE
GLUCOSE, UA: NEGATIVE
Hgb urine dipstick: NEGATIVE
KETONES UR: NEGATIVE
NITRITE: NEGATIVE
PH: 6 (ref 5.0–8.0)
Protein, ur: NEGATIVE
SPECIFIC GRAVITY, URINE: 1.022 (ref 1.001–1.035)

## 2016-06-14 LAB — TSH: TSH: 2.52 mIU/L

## 2016-06-14 LAB — COMPREHENSIVE METABOLIC PANEL
ALK PHOS: 69 U/L (ref 33–115)
ALT: 12 U/L (ref 6–29)
AST: 16 U/L (ref 10–35)
Albumin: 3.6 g/dL (ref 3.6–5.1)
BILIRUBIN TOTAL: 0.2 mg/dL (ref 0.2–1.2)
BUN: 11 mg/dL (ref 7–25)
CALCIUM: 8.9 mg/dL (ref 8.6–10.2)
CO2: 26 mmol/L (ref 20–31)
Chloride: 103 mmol/L (ref 98–110)
Creat: 0.64 mg/dL (ref 0.50–1.10)
Glucose, Bld: 103 mg/dL — ABNORMAL HIGH (ref 65–99)
Potassium: 3.8 mmol/L (ref 3.5–5.3)
Sodium: 137 mmol/L (ref 135–146)
Total Protein: 6.5 g/dL (ref 6.1–8.1)

## 2016-06-14 LAB — BRAIN NATRIURETIC PEPTIDE: Brain Natriuretic Peptide: 40.6 pg/mL (ref <100)

## 2016-06-14 NOTE — Telephone Encounter (Signed)
Returning your call , please call  ° °Thanks  °

## 2016-06-14 NOTE — Telephone Encounter (Signed)
Informed pt of lab results. Pt verbalized understanding. 

## 2016-06-15 ENCOUNTER — Telehealth: Payer: Self-pay | Admitting: *Deleted

## 2016-06-15 DIAGNOSIS — R600 Localized edema: Secondary | ICD-10-CM

## 2016-06-15 MED ORDER — SPIRONOLACTONE 25 MG PO TABS: 25.0000 mg | ORAL_TABLET | Freq: Every day | ORAL | 3 refills | Status: DC

## 2016-06-15 NOTE — Telephone Encounter (Signed)
-----   Message from Lyn RecordsHenry W Smith, MD sent at 06/14/2016  5:23 PM EST ----- Let the patient know all labs okay to this point. Add Spironolactone 25 mg daily to furosemide 40 mg daily. BMET in 7 days. A copy will be [facility] to [facility]

## 2016-06-15 NOTE — Telephone Encounter (Signed)
Spoke with pt and advised her of recommendations per Dr. Katrinka BlazingSmith.  Pt will come in 12/22 for labs.  Pt verbalized understanding and was in agreement with this plan.

## 2016-06-18 DIAGNOSIS — L02214 Cutaneous abscess of groin: Secondary | ICD-10-CM | POA: Diagnosis not present

## 2016-06-18 DIAGNOSIS — L03314 Cellulitis of groin: Secondary | ICD-10-CM | POA: Diagnosis not present

## 2016-06-26 MED FILL — VENLAFAXINE HCL ER 75 MG CA: 75 | 30 days supply | Qty: 30 | Fill #11

## 2016-06-26 MED FILL — SPIRONOLACTONE 25 MG TABLET: 25 | 90 days supply | Qty: 90 | Fill #0

## 2016-06-26 MED FILL — METOPROLOL SUCC ER 25 MG TA: 25 | 30 days supply | Qty: 30 | Fill #2

## 2016-06-26 MED FILL — AMITRIPTYLINE HCL 50 MG TAB: 50 | 30 days supply | Qty: 30 | Fill #10

## 2016-06-26 MED FILL — LISINOPRIL 5 MG TABLET: 5 | 30 days supply | Qty: 30 | Fill #2

## 2016-06-26 MED FILL — ESOMEPRAZOLE MAG DR 40 MG C: 40 | 30 days supply | Qty: 30 | Fill #11

## 2016-06-27 ENCOUNTER — Telehealth: Payer: Self-pay | Admitting: Interventional Cardiology

## 2016-06-27 NOTE — Telephone Encounter (Signed)
New Message:   Dr Katrinka BlazingSmith prescribed on 06-13-16 Aldactone.When the pt when to pick it up from the pharmacy she was advised by the pharmacist that  there is an interaction with her Lisinopril. Is it alright for to take the Aldactone?

## 2016-06-27 NOTE — Telephone Encounter (Signed)
Patient states that Dr. Katrinka BlazingSmith added spironolactone (aldactone) to her meds on 06/15/16 and when she went to the pharmacy to fill the prescription they told her that there is an interaction between the aldactone and the lisinopril that she is taking and wants to know if it is okay to take the aldactone. I told the patient that I would route to our pharmacist and to Dr. Katrinka BlazingSmith and that someone from our office would get back to her and let her know. Patient verbalized understanding.

## 2016-06-27 NOTE — Telephone Encounter (Signed)
Spoke to patient and she reports she has not yet started spironolactone as she was unable to get to pharmacy to pick up until yesterday. She waited to start until she spoke to someone from our office after being counseled by the pharmacist. Advised patient that risk is hyperkalemia and that potassium level would be checked one week after start. She has ECHO schedule for 1/3 (1 week from today) have added her to lab schedule for that day as well. She will begin spironolactone today. She states understanding and appreciation for phone call.

## 2016-06-27 NOTE — Telephone Encounter (Signed)
LMTCB to discuss interaction with spiro and lisinopril (risk for hyperkalemia). BMET already ordered by Dr. Katrinka BlazingSmith for 7 days after start of spironolactone per phone note on 06/15/16.

## 2016-07-04 ENCOUNTER — Ambulatory Visit (HOSPITAL_COMMUNITY): Payer: 59 | Attending: Cardiovascular Disease

## 2016-07-04 ENCOUNTER — Other Ambulatory Visit: Payer: 59

## 2016-07-04 ENCOUNTER — Other Ambulatory Visit: Payer: Self-pay

## 2016-07-04 DIAGNOSIS — E669 Obesity, unspecified: Secondary | ICD-10-CM | POA: Diagnosis not present

## 2016-07-04 DIAGNOSIS — R21 Rash and other nonspecific skin eruption: Secondary | ICD-10-CM | POA: Diagnosis not present

## 2016-07-04 DIAGNOSIS — R0609 Other forms of dyspnea: Secondary | ICD-10-CM | POA: Diagnosis not present

## 2016-07-04 DIAGNOSIS — R0600 Dyspnea, unspecified: Secondary | ICD-10-CM

## 2016-07-04 MED ORDER — PERFLUTREN LIPID MICROSPHERE
1.0000 mL | INTRAVENOUS | Status: AC | PRN
  Administered 2016-07-04: 3 mL via INTRAVENOUS

## 2016-07-09 ENCOUNTER — Encounter: Payer: Self-pay | Admitting: Nurse Practitioner

## 2016-07-09 ENCOUNTER — Ambulatory Visit (INDEPENDENT_AMBULATORY_CARE_PROVIDER_SITE_OTHER): Payer: 59 | Admitting: Nurse Practitioner

## 2016-07-09 VITALS — BP 130/82 | HR 91 | Ht 65.5 in | Wt 290.4 lb

## 2016-07-09 DIAGNOSIS — I5032 Chronic diastolic (congestive) heart failure: Secondary | ICD-10-CM

## 2016-07-09 NOTE — Patient Instructions (Addendum)
We will be checking the following labs today - BMET   Medication Instructions:    Continue with your current medicines.   Will see what your labs show and then decide about your Lasix.     Testing/Procedures To Be Arranged:  N/A  Follow-Up:   See me in 3 months    Other Special Instructions:   You know what you need to do.   Restrict salt  Monitor BP  Compression stockings    If you need a refill on your cardiac medications before your next appointment, please call your pharmacy.   Call the [facility] office at [phone] if you have any questions, problems or concerns.
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
## 2016-07-10 LAB — BASIC METABOLIC PANEL
BUN/Creatinine Ratio: 23 (ref 9–23)
BUN: 13 mg/dL (ref 6–24)
CO2: 25 mmol/L (ref 18–29)
Calcium: 9.1 mg/dL (ref 8.7–10.2)
Chloride: 98 mmol/L (ref 96–106)
Creatinine, Ser: 0.56 mg/dL — ABNORMAL LOW (ref 0.57–1.00)
GFR calc Af Amer: 129 mL/min/{1.73_m2} (ref >59)
GFR calc non Af Amer: 112 mL/min/{1.73_m2} (ref >59)
Glucose: 112 mg/dL — ABNORMAL HIGH (ref 65–99)
Potassium: 4.5 mmol/L (ref 3.5–5.2)
Sodium: 141 mmol/L (ref 134–144)

## 2016-07-16 ENCOUNTER — Telehealth: Payer: Self-pay | Admitting: *Deleted

## 2016-07-16 ENCOUNTER — Other Ambulatory Visit: Payer: Self-pay | Admitting: *Deleted

## 2016-07-16 MED ORDER — FUROSEMIDE 40 MG PO TABS: ORAL_TABLET | ORAL | 9 refills | Status: DC

## 2016-07-16 MED ORDER — LISINOPRIL 5 MG PO TABS: 5.0000 mg | ORAL_TABLET | Freq: Every day | ORAL | 9 refills | Status: DC

## 2016-07-16 MED ORDER — METOPROLOL SUCCINATE ER 25 MG PO TB24: 25.0000 mg | ORAL_TABLET | Freq: Every day | ORAL | 9 refills | Status: DC

## 2016-07-16 MED FILL — MONTELUKAST SOD 10 MG TAB: 10 | 30 days supply | Qty: 30 | Fill #0

## 2016-07-16 NOTE — Telephone Encounter (Signed)
Patient called and requested a refill on [facility] and also to get the results of her recent labs. Was not sure how to refill the furosemide as patient stated that her dose was going to be determined by her lab results. She reports that she has been taking 40 mg qd and an additional 40 mg prn. Patient can be reached at [phone]. Thanks, MI

## 2016-07-17 ENCOUNTER — Other Ambulatory Visit: Payer: Self-pay | Admitting: *Deleted

## 2016-07-17 MED ORDER — FUROSEMIDE 40 MG PO TABS: ORAL_TABLET | ORAL | 9 refills | Status: DC

## 2016-07-17 MED FILL — FUROSEMIDE 40 MG TABLET: 40 | 15 days supply | Qty: 30 | Fill #0

## 2016-07-17 NOTE — Telephone Encounter (Signed)
Stated a new script was sent in to [facility] stating pt can take extra dose of lasix for 2-3 lb weight gain in 24 hours or swelling.  Cannot put on script take 1-2 everyday.

## 2016-07-17 NOTE — Telephone Encounter (Signed)
Patient left a msg on the refill vm stating that the pharmacy will not refill the furosemide as it is too soon per insurance. She requested that the rx be sent with a sig of take 1-2 tablets qd. Patient can be reached at [phone]. Thanks, MI

## 2016-07-24 ENCOUNTER — Encounter (HOSPITAL_BASED_OUTPATIENT_CLINIC_OR_DEPARTMENT_OTHER): Payer: 59

## 2016-07-30 MED FILL — METOPROLOL SUCC ER 25 MG TA: 25 | 30 days supply | Qty: 30 | Fill #3

## 2016-07-30 MED FILL — FUROSEMIDE 40 MG TABLET: 40 | 15 days supply | Qty: 30 | Fill #1

## 2016-07-30 MED FILL — AMITRIPTYLINE HCL 50 MG TAB: 50 | 30 days supply | Qty: 30 | Fill #0

## 2016-07-30 MED FILL — LISINOPRIL 5 MG TABLET: 5 | 30 days supply | Qty: 30 | Fill #3

## 2016-08-20 DIAGNOSIS — L02234 Carbuncle of groin: Secondary | ICD-10-CM | POA: Diagnosis not present

## 2016-08-20 MED FILL — FLUCONAZOLE 200 MG TABLET: 200 | 9 days supply | Qty: 3 | Fill #0

## 2016-08-20 MED FILL — SULFAMETHOXAZOLE/TMP DS TAB: 800-160 | 10 days supply | Qty: 20 | Fill #0

## 2016-08-29 MED FILL — FUROSEMIDE 40 MG TABLET: 40 | 30 days supply | Qty: 30 | Fill #0

## 2016-08-29 MED FILL — METOPROLOL SUCC ER 25 MG TA: 25 | 90 days supply | Qty: 90 | Fill #0

## 2016-08-29 MED FILL — ESOMEPRAZOLE MAG DR 40 MG C: 40 | 30 days supply | Qty: 30 | Fill #0

## 2016-08-29 MED FILL — AMITRIPTYLINE HCL 50 MG TAB: 50 | 30 days supply | Qty: 30 | Fill #1

## 2016-08-29 MED FILL — LISINOPRIL 5 MG TABLET: 5 | 90 days supply | Qty: 90 | Fill #0

## 2016-08-29 MED FILL — MONTELUKAST SOD 10 MG TAB: 10 | 30 days supply | Qty: 30 | Fill #1

## 2016-10-08 ENCOUNTER — Ambulatory Visit: Payer: 59 | Admitting: Nurse Practitioner

## 2016-10-08 MED FILL — FUROSEMIDE 40 MG TABLET: 40 | 30 days supply | Qty: 30 | Fill #1

## 2016-10-08 MED FILL — ESOMEPRAZOLE MAG DR 40 MG C: 40 | 30 days supply | Qty: 30 | Fill #1

## 2016-10-08 MED FILL — MONTELUKAST SOD 10 MG TAB: 10 | 30 days supply | Qty: 30 | Fill #2

## 2016-10-08 MED FILL — AMITRIPTYLINE HCL 50 MG TAB: 50 | 30 days supply | Qty: 30 | Fill #2

## 2016-11-06 MED FILL — FUROSEMIDE 40 MG TABLET: 40 | 15 days supply | Qty: 30 | Fill #2

## 2016-11-19 MED FILL — LISINOPRIL 5 MG TABLET: 5 | 90 days supply | Qty: 90 | Fill #1

## 2016-11-19 MED FILL — MONTELUKAST SOD 10 MG TAB: 10 | 30 days supply | Qty: 30 | Fill #0

## 2016-11-19 MED FILL — ESOMEPRAZOLE MAG DR 40 MG C: 40 | 30 days supply | Qty: 30 | Fill #0

## 2016-11-19 MED FILL — AMITRIPTYLINE HCL 50 MG TAB: 50 | 30 days supply | Qty: 30 | Fill #0

## 2016-11-19 MED FILL — METOPROLOL SUCC ER 25 MG TA: 25 | 90 days supply | Qty: 90 | Fill #1

## 2016-11-28 MED FILL — FUROSEMIDE 40 MG TABLET: 40 | 15 days supply | Qty: 30 | Fill #3

## 2016-12-19 MED FILL — AMITRIPTYLINE HCL 50 MG TAB: 50 | 30 days supply | Qty: 30 | Fill #0

## 2016-12-21 MED FILL — FUROSEMIDE 40 MG TABLET: 40 | 15 days supply | Qty: 30 | Fill #4

## 2017-01-09 MED FILL — FUROSEMIDE 40 MG TABLET: 40 | 15 days supply | Qty: 30 | Fill #5

## 2017-01-21 MED FILL — AMITRIPTYLINE HCL 50 MG TAB: 50 | 30 days supply | Qty: 30 | Fill #0

## 2017-02-04 MED FILL — FUROSEMIDE 40 MG TABLET: 40 | 15 days supply | Qty: 30 | Fill #6

## 2017-02-18 MED FILL — AMITRIPTYLINE HCL 50 MG TAB: 50 | 30 days supply | Qty: 30 | Fill #1

## 2017-02-27 MED FILL — LISINOPRIL 5 MG TABLET: 5 | 90 days supply | Qty: 90 | Fill #2

## 2017-02-27 MED FILL — FUROSEMIDE 40 MG TABLET: 40 | 15 days supply | Qty: 30 | Fill #7

## 2017-02-27 MED FILL — METOPROLOL SUCC ER 25 MG TA: 25 | 90 days supply | Qty: 90 | Fill #2

## 2017-03-19 MED FILL — FUROSEMIDE 40 MG TABLET: 40 | 15 days supply | Qty: 30 | Fill #8

## 2017-03-19 MED FILL — AMITRIPTYLINE HCL 50 MG TAB: 50 | 30 days supply | Qty: 30 | Fill #2

## 2017-03-29 DIAGNOSIS — Z01419 Encounter for gynecological examination (general) (routine) without abnormal findings: Secondary | ICD-10-CM | POA: Diagnosis not present

## 2017-03-29 DIAGNOSIS — Z124 Encounter for screening for malignant neoplasm of cervix: Secondary | ICD-10-CM | POA: Diagnosis not present

## 2017-04-15 MED FILL — FUROSEMIDE 40 MG TABLET: 40 | 15 days supply | Qty: 30 | Fill #9

## 2017-04-23 MED FILL — AMITRIPTYLINE HCL 50 MG TAB: 50 | 30 days supply | Qty: 30 | Fill #0

## 2017-05-07 ENCOUNTER — Other Ambulatory Visit: Payer: Self-pay | Admitting: Nurse Practitioner

## 2017-05-07 MED FILL — FUROSEMIDE 40 MG TAB: 40 | 30 days supply | Qty: 45 | Fill #0

## 2017-05-24 MED FILL — AMITRIPTYLINE HCL 50 MG TAB: 50 | 30 days supply | Qty: 30 | Fill #1

## 2017-05-27 MED FILL — METOPROLOL SUCC ER 25 MG TA: 25 | 30 days supply | Qty: 30 | Fill #3

## 2017-06-17 ENCOUNTER — Other Ambulatory Visit: Payer: Self-pay | Admitting: Nurse Practitioner

## 2017-06-17 MED FILL — AMITRIPTYLINE HCL 50 MG TAB: 50 | 30 days supply | Qty: 30 | Fill #2

## 2017-06-17 MED FILL — LISINOPRIL 5 MG TABLET: 5 | 30 days supply | Qty: 30 | Fill #3

## 2017-06-17 MED FILL — FUROSEMIDE 40 MG TAB: 40 | 30 days supply | Qty: 45 | Fill #1

## 2017-06-19 DIAGNOSIS — R05 Cough: Secondary | ICD-10-CM | POA: Diagnosis not present

## 2017-06-19 DIAGNOSIS — J069 Acute upper respiratory infection, unspecified: Secondary | ICD-10-CM | POA: Diagnosis not present

## 2017-06-20 MED FILL — METOPROLOL SUCC ER 25 MG TA: 25 | 30 days supply | Qty: 30 | Fill #0

## 2017-06-26 DIAGNOSIS — J209 Acute bronchitis, unspecified: Secondary | ICD-10-CM | POA: Diagnosis not present

## 2017-07-15 DIAGNOSIS — J0100 Acute maxillary sinusitis, unspecified: Secondary | ICD-10-CM | POA: Diagnosis not present

## 2017-07-22 ENCOUNTER — Other Ambulatory Visit: Payer: Self-pay | Admitting: Nurse Practitioner

## 2017-07-22 MED FILL — LISINOPRIL 5 MG TABLET: 5 | 30 days supply | Qty: 30 | Fill #0

## 2017-07-30 MED FILL — METOPROLOL SUCC ER 25 MG TA: 25 | 30 days supply | Qty: 30 | Fill #1

## 2017-07-31 MED FILL — AMITRIPTYLINE HCL 50 MG TAB: 50 | 30 days supply | Qty: 30 | Fill #0

## 2017-08-06 ENCOUNTER — Ambulatory Visit: Payer: 59 | Admitting: Nurse Practitioner

## 2017-08-06 ENCOUNTER — Encounter: Payer: Self-pay | Admitting: Nurse Practitioner

## 2017-08-06 ENCOUNTER — Encounter (INDEPENDENT_AMBULATORY_CARE_PROVIDER_SITE_OTHER): Payer: Self-pay

## 2017-08-06 VITALS — BP 160/80 | HR 110 | Ht 65.5 in | Wt 301.1 lb

## 2017-08-06 DIAGNOSIS — I10 Essential (primary) hypertension: Secondary | ICD-10-CM | POA: Diagnosis not present

## 2017-08-06 DIAGNOSIS — I5032 Chronic diastolic (congestive) heart failure: Secondary | ICD-10-CM

## 2017-08-06 MED ORDER — METOPROLOL SUCCINATE ER 25 MG PO TB24: 25.0000 mg | ORAL_TABLET | Freq: Every day | ORAL | 3 refills | Status: DC

## 2017-08-06 MED ORDER — FUROSEMIDE 40 MG PO TABS: ORAL_TABLET | ORAL | 3 refills | Status: DC

## 2017-08-06 MED ORDER — LISINOPRIL 5 MG PO TABS: 5.0000 mg | ORAL_TABLET | Freq: Every day | ORAL | 3 refills | Status: DC

## 2017-08-06 MED FILL — FUROSEMIDE 40 MG TAB: 40 | 45 days supply | Qty: 90 | Fill #0

## 2017-08-06 NOTE — Patient Instructions (Addendum)
We will be checking the following labs today - BMET, CBC, HPF, Lipids, TSH   Medication Instructions:    Continue with your current medicines.   I sent in your refills today    Testing/Procedures To Be Arranged:  N/A  Follow-Up:   See me in 6 months - think about what we talked about today.     Other Special Instructions:   N/A    If you need a refill on your cardiac medications before your next appointment, please call your pharmacy.   Call the [facility] office at [phone] if you have any questions, problems or concerns.
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
## 2017-08-07 ENCOUNTER — Telehealth: Payer: Self-pay | Admitting: Nurse Practitioner

## 2017-08-07 LAB — CBC
Hematocrit: 39.4 % (ref 34.0–46.6)
Hemoglobin: 12.9 g/dL (ref 11.1–15.9)
MCH: 28.8 pg (ref 26.6–33.0)
MCHC: 32.7 g/dL (ref 31.5–35.7)
MCV: 88 fL (ref 79–97)
Platelets: 344 10*3/uL (ref 150–379)
RBC: 4.48 x10E6/uL (ref 3.77–5.28)
RDW: 14.9 % (ref 12.3–15.4)
WBC: 9.3 10*3/uL (ref 3.4–10.8)

## 2017-08-07 LAB — LIPID PANEL
Chol/HDL Ratio: 2.6 ratio (ref 0.0–4.4)
Cholesterol, Total: 194 mg/dL (ref 100–199)
HDL: 76 mg/dL (ref >39)
LDL Calculated: 91 mg/dL (ref 0–99)
Triglycerides: 135 mg/dL (ref 0–149)
VLDL Cholesterol Cal: 27 mg/dL (ref 5–40)

## 2017-08-07 LAB — BASIC METABOLIC PANEL
BUN/Creatinine Ratio: 20 (ref 9–23)
BUN: 12 mg/dL (ref 6–24)
CO2: 22 mmol/L (ref 20–29)
Calcium: 9.3 mg/dL (ref 8.7–10.2)
Chloride: 102 mmol/L (ref 96–106)
Creatinine, Ser: 0.61 mg/dL (ref 0.57–1.00)
GFR calc Af Amer: 125 mL/min/{1.73_m2} (ref >59)
GFR calc non Af Amer: 108 mL/min/{1.73_m2} (ref >59)
Glucose: 107 mg/dL — ABNORMAL HIGH (ref 65–99)
Potassium: 4.1 mmol/L (ref 3.5–5.2)
Sodium: 141 mmol/L (ref 134–144)

## 2017-08-07 LAB — HEPATIC FUNCTION PANEL
ALT: 12 IU/L (ref 0–32)
AST: 18 IU/L (ref 0–40)
Albumin: 4 g/dL (ref 3.5–5.5)
Alkaline Phosphatase: 76 IU/L (ref 39–117)
Bilirubin Total: <0.2 mg/dL (ref 0.0–1.2)
Bilirubin, Direct: 0.04 mg/dL (ref 0.00–0.40)
Total Protein: 6.9 g/dL (ref 6.0–8.5)

## 2017-08-07 LAB — TSH: TSH: 3.41 u[IU]/mL (ref 0.450–4.500)

## 2017-08-07 NOTE — Telephone Encounter (Signed)
Mrs. Brittany [name] is returning your call. Thanks

## 2017-08-22 MED FILL — LISINOPRIL 5 MG TABLET: 5 | 90 days supply | Qty: 90 | Fill #0

## 2017-08-23 MED FILL — METOPROLOL SUCCINATE ER 25: 25 | 90 days supply | Qty: 90 | Fill #0

## 2017-09-25 DIAGNOSIS — J0100 Acute maxillary sinusitis, unspecified: Secondary | ICD-10-CM | POA: Diagnosis not present

## 2017-09-25 DIAGNOSIS — J209 Acute bronchitis, unspecified: Secondary | ICD-10-CM | POA: Diagnosis not present

## 2017-10-17 DIAGNOSIS — J209 Acute bronchitis, unspecified: Secondary | ICD-10-CM | POA: Diagnosis not present

## 2017-10-30 IMAGING — CR DG CHEST 2V
2 series · 2 of 2 positions shown · non-contrast
Comparison: 09/12/2003

CLINICAL DATA: Cough, dyspnea and congestion

EXAM:
CHEST  2 VIEW

[w chest pa]
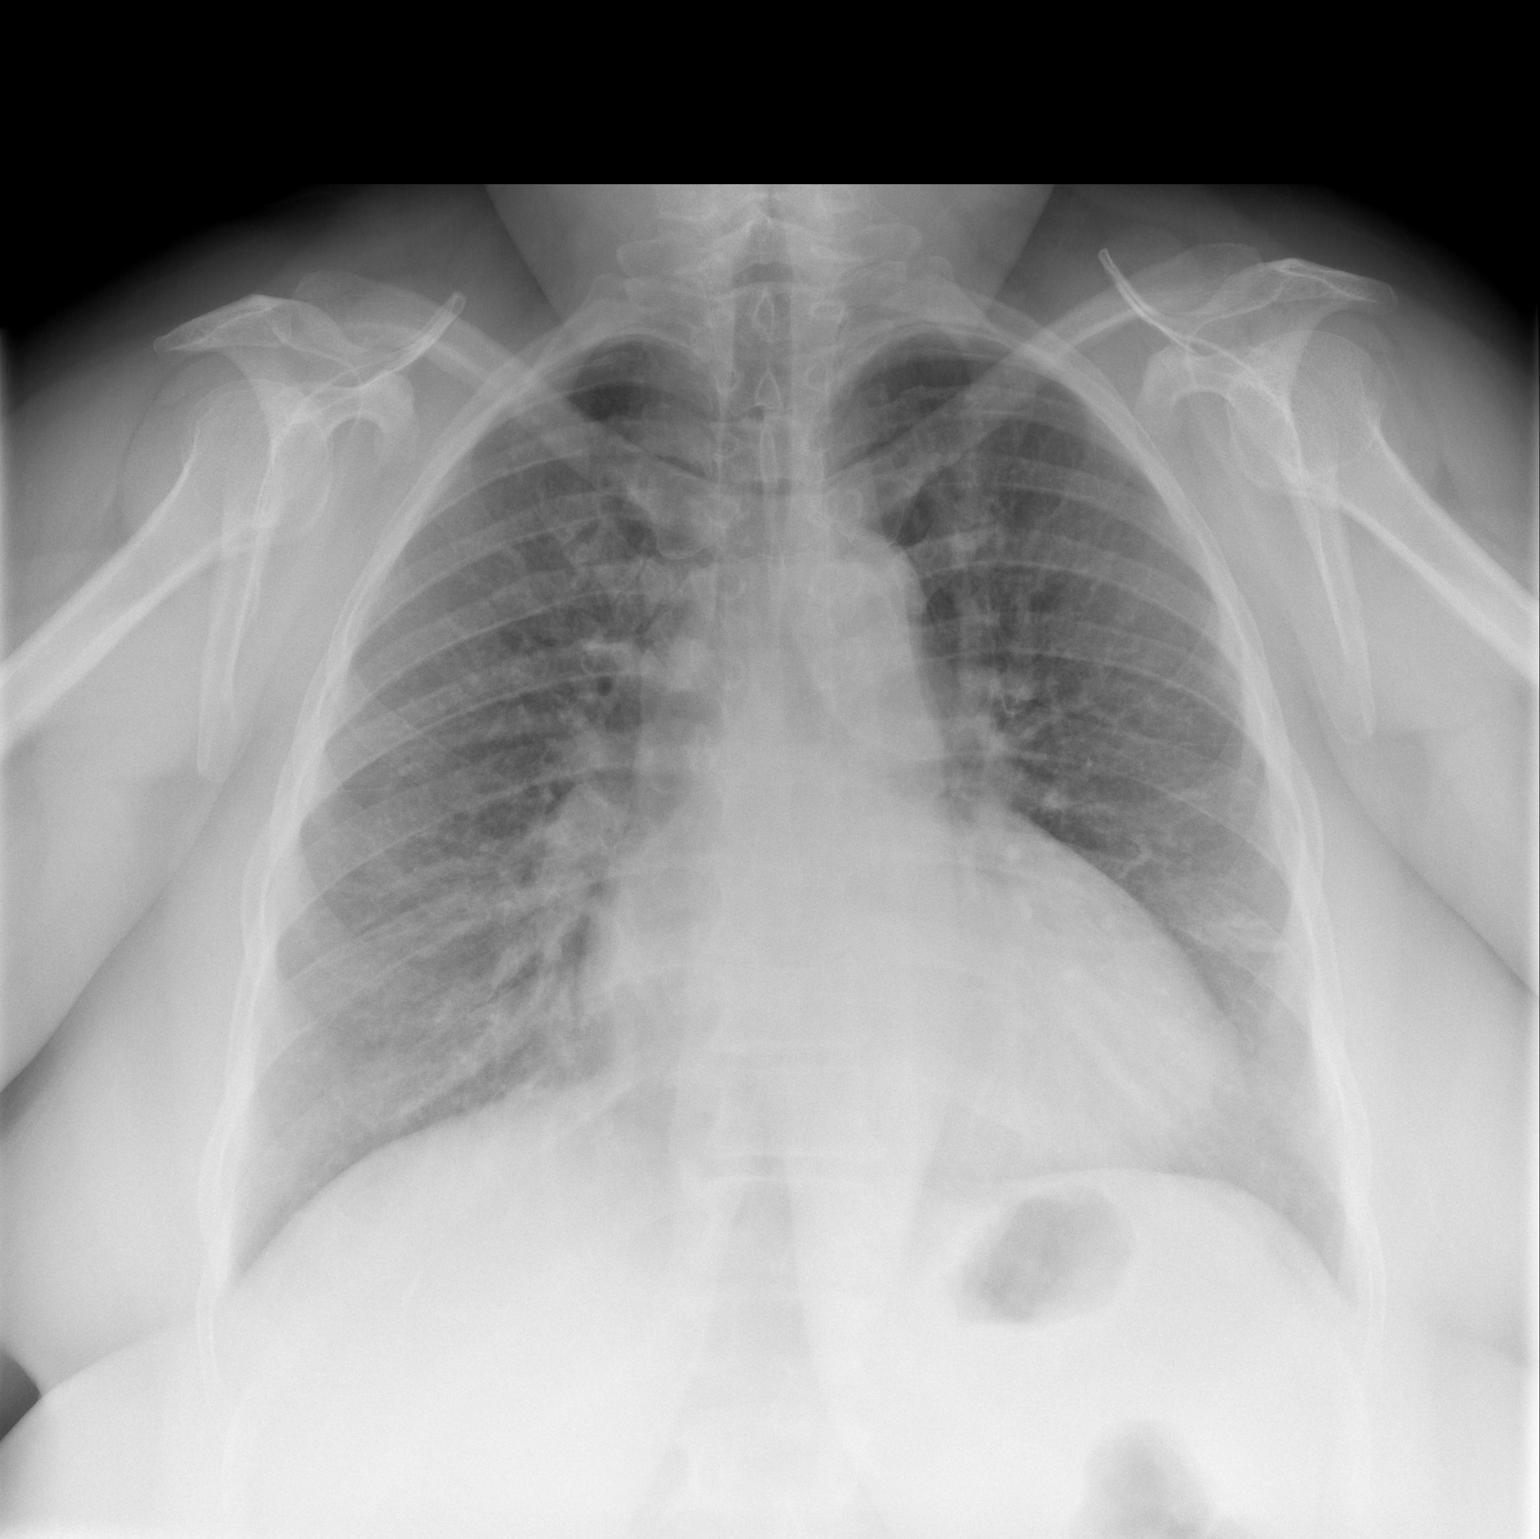

[w chest lat]
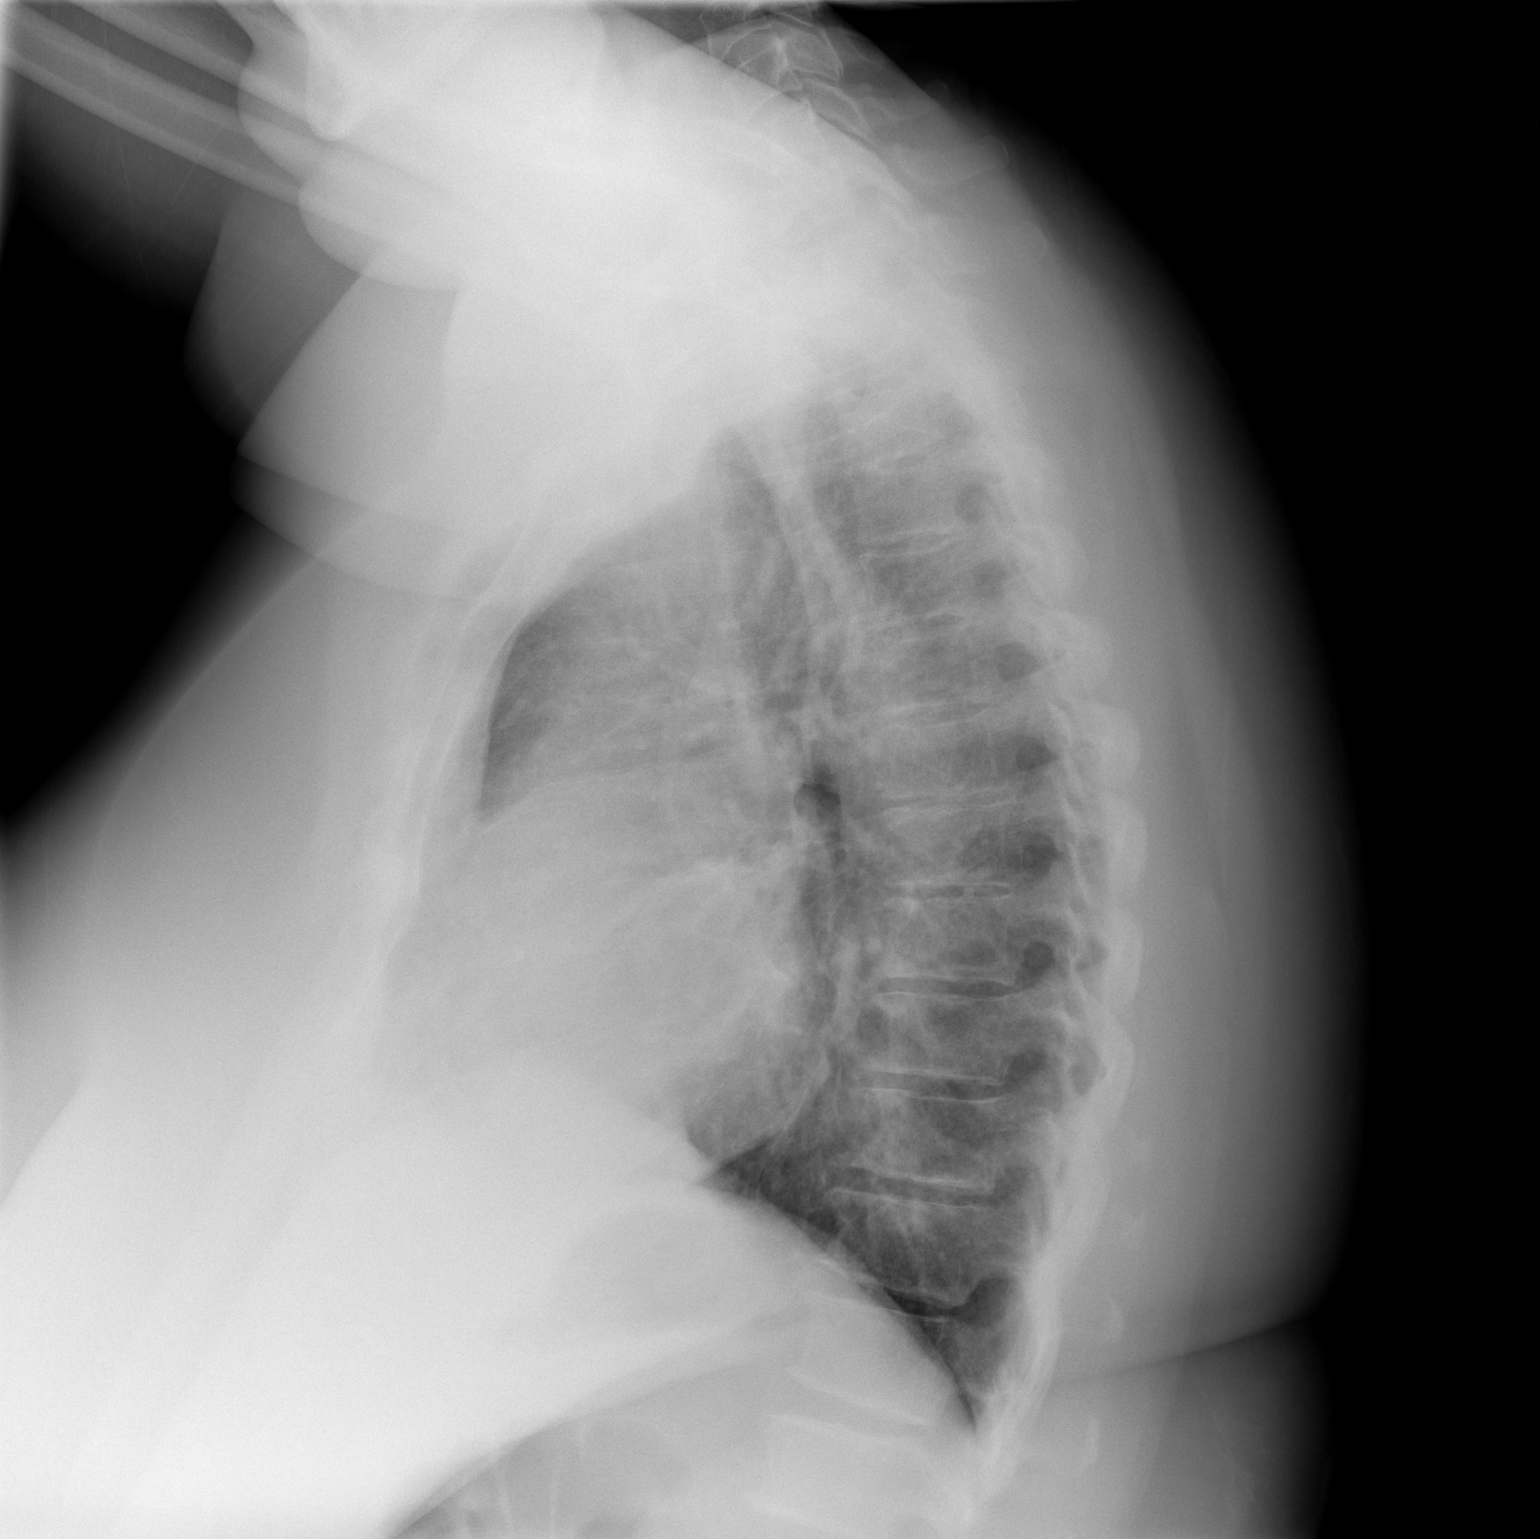

[2 of 2 positions shown; findings below may reference images not displayed]

FINDINGS: The cardiac silhouette is borderline enlarged. There is mild
interstitial edema. No pneumonic consolidation seen. The
subsegmental atelectasis peripherally at the left base. No acute
osseous abnormality.
IMPRESSION: Mild interstitial pulmonary edema and borderline cardiomegaly. No
pneumonic consolidation.

## 2017-11-06 MED FILL — FUROSEMIDE 40 MG TAB: 40 | 45 days supply | Qty: 90 | Fill #1

## 2017-11-20 MED FILL — METOPROLOL SUCCINATE ER 25: 25 | 90 days supply | Qty: 90 | Fill #1

## 2017-11-20 MED FILL — LISINOPRIL 5 MG TABLET: 5 | 90 days supply | Qty: 90 | Fill #1

## 2017-12-04 DIAGNOSIS — N62 Hypertrophy of breast: Secondary | ICD-10-CM | POA: Diagnosis not present

## 2017-12-06 ENCOUNTER — Telehealth: Payer: Self-pay | Admitting: Interventional Cardiology

## 2017-12-06 NOTE — Telephone Encounter (Signed)
Pt was suppose to have a sleep study done over  Year ago, order in, has decided to now have it done pls call [phone]

## 2017-12-09 DIAGNOSIS — Z1231 Encounter for screening mammogram for malignant neoplasm of breast: Secondary | ICD-10-CM | POA: Diagnosis not present

## 2017-12-24 ENCOUNTER — Telehealth: Payer: Self-pay | Admitting: *Deleted

## 2017-12-24 ENCOUNTER — Other Ambulatory Visit: Payer: Self-pay | Admitting: *Deleted

## 2017-12-24 DIAGNOSIS — R0683 Snoring: Secondary | ICD-10-CM

## 2017-12-24 NOTE — Telephone Encounter (Signed)
Redgie GrayerHey Jennifer,  Looks like Dr. Katrinka BlazingSmith ordered this sleep study, it's been awhile.  Please look into this and see if he wants to order and please look at phone notes.    Thanks danielle

## 2017-12-25 NOTE — Addendum Note (Signed)
Addended by: Julio SicksBOWERS, Metta Koranda L on: 12/25/2017 12:26 PM   Modules accepted: Orders

## 2017-12-25 NOTE — Telephone Encounter (Signed)
Order placed for sleep study.  Will send to sleep study team to make them aware.

## 2017-12-25 NOTE — Telephone Encounter (Signed)
I have not seen her in 1-1/2 years.  If she is calling and requesting the study we should perform it.  Otherwise I would leave this to her primary physician.

## 2017-12-25 NOTE — Telephone Encounter (Signed)
Pt did not follow through with sleep study you ordered back in 06/2016.  It was ordered for snoring.  Last seen by Lawson FiscalLori on 08/10/17.  Do you want to reorder sleep study?

## 2017-12-25 NOTE — Telephone Encounter (Signed)
[facility] patient declined sleep study. If she now wants to proceed the office note will need to be adended to say she is ordering a sleep study. For insurance purposes, Sleep studies cannot be ordered based on a phone call conversation. It has to be documented in office notes. Along with symptoms. Please have Lawson FiscalLori to addend her note otherwise the patient will need another OV to document symptoms and that a sleep study will be ordered. Thanks.

## 2017-12-26 NOTE — Telephone Encounter (Signed)
I would defer to her primary care MD

## 2017-12-26 NOTE — Addendum Note (Signed)
Addended by: Julio SicksBOWERS, Shanina Kepple L on: 12/26/2017 04:21 PM   Modules accepted: Orders

## 2017-12-26 NOTE — Telephone Encounter (Addendum)
Follow up    Please return call to patient at [phone].

## 2017-12-26 NOTE — Telephone Encounter (Signed)
Spoke with patient and she will contact her primary care doctor for a possible sleep study. Pt is going to have her surgeon send us a surgical clearance form for a breast reduction around the end of July/ August.

## 2017-12-26 NOTE — Telephone Encounter (Signed)
Left message to call back  

## 2017-12-27 ENCOUNTER — Telehealth: Payer: Self-pay

## 2017-12-27 DIAGNOSIS — G4730 Sleep apnea, unspecified: Secondary | ICD-10-CM

## 2017-12-27 NOTE — Telephone Encounter (Signed)
   [facility] Pre-operative Risk Assessment    Request for surgical clearance:  1. What type of surgery is being performed? Breast Reduction   2. When is this surgery scheduled? TBD pending insurance approval   3. What type of clearance is required (medical clearance vs. Pharmacy clearance to hold med vs. Both)? Is patient cleared for anesthesia due to pulmonary edema diagnosis? Does a sleep study need to be performed prior to patient undergoing surgery under general anesthesia?   4. Are there any medications that need to be held prior to surgery and how long? None stated   5. Practice name and name of physician performing surgery? [facility]; Crissie Reese    6. What is your office phone number [phone]    7.   What is your office fax number [phone]  8.   Anesthesia type (None, local, MAC, general) ? General  Brittany [name] 12/27/2017, 1:34 PM  _________________________________________________________________   (provider comments below)

## 2017-12-27 NOTE — Telephone Encounter (Addendum)
   Primary Cardiologist: Brittany NoeHenry W Smith III, MD  Chart reviewed as part of pre-operative protocol coverage. Patient was contacted 12/27/2017 in reference to pre-operative risk assessment for pending surgery as outlined below.  Brittany [name] was last seen on 08/06/2017 by [name], NP.  Since that day, Brittany [name] has done well.   She is having no problems waking with LE edema, no orthopnea or PND.  She does not have chest pain or palpitations.  No significant weight change.  She snores heavily, she is high risk for sleep apnea.  She has been reluctant to have the sleep study, but in discussions with her, she agrees that it would be safest if she went ahead and had this done.    [name], NP, saw the patient and will have to be the one to reorder the test.  Therefore, based on ACC/AHA guidelines, the patient would be at acceptable risk for the planned procedure without further cardiovascular testing, but it would be prudent to get the sleep study prior to her procedure.   I will route this to the call back pool so that it can be scheduled.  I will route this recommendation to the requesting party via Epic fax function and remove from pre-op pool once the sleep study is completed.  Please call with questions.  Theodore Demarkhonda Barrett, PA-C 12/27/2017, 1:56 PM   [facility]; Etter Sjogrenavid [facility]   office phone number [phone]  office fax number [phone]

## 2017-12-30 MED FILL — FUROSEMIDE 40 MG TAB: 40 | 45 days supply | Qty: 90 | Fill #2

## 2017-12-30 NOTE — Telephone Encounter (Signed)
Left message to call back  

## 2017-12-30 NOTE — Telephone Encounter (Signed)
Spoke with pt and made her aware that PCP will need to order sleep study.  Pt verbalized understanding.

## 2017-12-30 NOTE — Telephone Encounter (Signed)
Per prior note - I am not able to addend my last note when the patient told me she had no sleep issues at that time. She has been referred back to PCP for ordering.   Thanks.

## 2017-12-30 NOTE — Telephone Encounter (Signed)
          Primary Cardiologist: Lesleigh NoeHenry W Smith III, MD  Chart reviewed as part of pre-operative protocol coverage. Patient was contacted 12/27/2017 in reference to pre-operative risk assessment for pending surgery as outlined below.  Brittany [name] was last seen on 08/06/2017 by Brittany FredricksonLori Gerhardt, NP.  Since that day, Brittany [name] has done well.   Per call placed by Brittany Demarkhonda Barrett, PA, She is having no problems with LE edema, no orthopnea or PND.  She does not have chest pain or palpitations.  No significant weight change.  She snores heavily, she is high risk for sleep apnea.  She has been reluctant to have the sleep study, but in discussions with her, she agrees that it would be safest if she went ahead and had this done.   Therefore, based on ACC/AHA guidelines, the patient would be at acceptable risk for the planned procedure without further cardiovascular testing. She is encouraged to have a sleep study to evaluate for sleep apnea. This process will take several weeks. She can undergo her surgery prior to the final results. She will off course have airway management during and after her procedure.   I will route this recommendation to the requesting party via Epic fax function and remove from pre-op pool once the sleep study is completed.  Please call with questions  Brittany PrimusJanine Seleena [name], AGNP-C Brittany [name] 12/30/2017  3:43 PM Pager: [phone]    [facility]; Brittany [name] office phone [phone] office fax number [phone]

## 2018-01-13 DIAGNOSIS — J209 Acute bronchitis, unspecified: Secondary | ICD-10-CM | POA: Diagnosis not present

## 2018-01-13 DIAGNOSIS — J0100 Acute maxillary sinusitis, unspecified: Secondary | ICD-10-CM | POA: Diagnosis not present

## 2018-01-13 DIAGNOSIS — J309 Allergic rhinitis, unspecified: Secondary | ICD-10-CM | POA: Diagnosis not present

## 2018-02-07 ENCOUNTER — Encounter (HOSPITAL_BASED_OUTPATIENT_CLINIC_OR_DEPARTMENT_OTHER): Payer: 59

## 2018-02-11 ENCOUNTER — Ambulatory Visit: Payer: 59 | Admitting: Nurse Practitioner

## 2018-03-04 MED FILL — LISINOPRIL 5 MG TABLET: 5 | 90 days supply | Qty: 90 | Fill #2

## 2018-03-04 MED FILL — FUROSEMIDE 40 MG TAB: 40 | 45 days supply | Qty: 90 | Fill #3

## 2018-03-04 MED FILL — METOPROLOL SUCCINATE ER 25: 25 | 90 days supply | Qty: 90 | Fill #2

## 2018-05-09 ENCOUNTER — Other Ambulatory Visit: Payer: Self-pay | Admitting: Nurse Practitioner

## 2018-05-09 MED ORDER — FUROSEMIDE 40 MG PO TABS: ORAL_TABLET | ORAL | 0 refills | Status: DC

## 2018-05-09 NOTE — Telephone Encounter (Signed)
°*  STAT* If patient is at the pharmacy, call can be transferred to refill team.   1. Which medications need to be refilled? (please list name of each medication and dose if known)   furosemide (LASIX) 40 MG tablet  2. Which pharmacy/location (including street and city if local pharmacy) is medication to be sent to?  [id number] - [place], [place] - [address]  3. Do they need a 30 day or 90 day supply? 90 days

## 2018-05-09 NOTE — Telephone Encounter (Signed)
Pt's medication was sent to pt's pharmacy as requested. Confirmation received.  °

## 2018-05-22 ENCOUNTER — Encounter: Payer: Self-pay | Admitting: Nurse Practitioner
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
## 2018-06-10 ENCOUNTER — Ambulatory Visit: Payer: 59 | Admitting: Nurse Practitioner

## 2018-06-10 DIAGNOSIS — R0989 Other specified symptoms and signs involving the circulatory and respiratory systems: Secondary | ICD-10-CM

## 2018-06-12 ENCOUNTER — Encounter: Payer: Self-pay | Admitting: Nurse Practitioner

## 2022-08-21 DIAGNOSIS — J9690 Respiratory failure, unspecified, unspecified whether with hypoxia or hypercapnia: Secondary | ICD-10-CM | POA: Diagnosis not present

## 2022-09-24 ENCOUNTER — Encounter (HOSPITAL_COMMUNITY): Payer: Self-pay

## 2022-09-24 ENCOUNTER — Telehealth (HOSPITAL_COMMUNITY): Payer: Self-pay

## 2022-09-24 ENCOUNTER — Other Ambulatory Visit (HOSPITAL_COMMUNITY): Payer: Self-pay

## 2022-09-24 DIAGNOSIS — I272 Pulmonary hypertension, unspecified: Secondary | ICD-10-CM

## 2022-09-24 NOTE — Telephone Encounter (Signed)
  [name] [place] [id number] [place] [address] Dept: [phone] Loc: [phone]  Brittany [name]  09/24/2022  You are scheduled for a Cardiac Catheterization on Tuesday, April 9 with Dr. Glori Bickers.  1. Please arrive at the [facility] (Main Entrance A) at [facility]: [address] [place], [place] [address] at 5:30 AM (This time is two hours before your procedure to ensure your preparation). Free valet parking service is available.   Special note: Every effort is made to have your procedure done on time. Please understand that emergencies sometimes delay scheduled procedures.  2. Diet: Do not eat solid foods after midnight.  The patient may have clear liquids until 5am upon the day of the procedure.  3. Labs: You will need to have blood drawn on Friday, April 5 at th [facility] (Entrance C). You do not need to be fasting. Gate Code: 1994  4. Medication instructions in preparation for your procedure:  DO NOT TAKE your Torsemide the day of your procedure.   On the morning of your procedure, take any morning medicines NOT listed above.  You may use sips of water.  5. Plan for one night stay--bring personal belongings. 6. Bring a current list of your medications and current insurance cards. 7. You MUST have a responsible person to drive you home. 8. Someone MUST be with you the first 24 hours after you arrive home or your discharge will be delayed. 9. Please wear clothes that are easy to get on and off and wear slip-on shoes.  Thank you for allowing [place] to care for you!   -- [facility]]

## 2022-09-26 ENCOUNTER — Telehealth (HOSPITAL_COMMUNITY): Payer: Self-pay | Admitting: Cardiology

## 2022-09-26 NOTE — Telephone Encounter (Signed)
Pt called to update medication list prior to upcoming cath -med list update

## 2022-09-28 ENCOUNTER — Telehealth (HOSPITAL_COMMUNITY): Payer: Self-pay | Admitting: Cardiology

## 2022-09-28 NOTE — Telephone Encounter (Signed)
Brittany [name] with cath lab called to state cath lab is unable to accommodate 4/9 appt. Cath r/s to 4/12 -pt aware        [place] [id number] [place] [address] Dept: [phone] Loc: [phone]   Brittany [name]                      09/24/2022   You are scheduled for a Cardiac Catheterization on Friday April 12 with Dr. Glori Bickers.   1. Please arrive at the [facility] (Main Entrance A) at [facility]: [address] [place], [place] [address] at 7;00am(This time is two hours before your procedure to ensure your preparation). Free valet parking service is available.    Special note: Every effort is made to have your procedure done on time. Please understand that emergencies sometimes delay scheduled procedures.   2. Diet: Do not eat solid foods after midnight.  The patient may have clear liquids until 5am upon the day of the procedure.   3. Labs: You will need to have blood drawn on Friday, April 5 at th [facility] (Entrance C). You do not need to be fasting. Gate Code: 1994   4. Medication instructions in preparation for your procedure:   DO NOT TAKE your Torsemide the day of your procedure.    On the morning of your procedure, take any morning medicines NOT listed above.  You may use sips of water.   5. Plan for one night stay--bring personal belongings. 6. Bring a current list of your medications and current insurance cards. 7. You MUST have a responsible person to drive you home. 8. Someone MUST be with you the first 24 hours after you arrive home or your discharge will be delayed. 9. Please wear clothes that are easy to get on and off and wear slip-on shoes.   Thank you for allowing [place] to care for you!   -- [facility]]

## 2022-10-05 ENCOUNTER — Ambulatory Visit (HOSPITAL_COMMUNITY)
Admission: RE | Admit: 2022-10-05 | Discharge: 2022-10-05 | Disposition: A | Discharge status: Home / Self Care | Payer: Commercial Managed Care - PPO | Source: Ambulatory Visit | Attending: Cardiology | Admitting: Cardiology

## 2022-10-05 DIAGNOSIS — I272 Pulmonary hypertension, unspecified: Secondary | ICD-10-CM

## 2022-10-05 LAB — BASIC METABOLIC PANEL
Anion gap: 13 (ref 5–15)
BUN: 14 mg/dL (ref 6–20)
CO2: 27 mmol/L (ref 22–32)
Calcium: 9.3 mg/dL (ref 8.9–10.3)
Chloride: 97 mmol/L — ABNORMAL LOW (ref 98–111)
Creatinine, Ser: 0.68 mg/dL (ref 0.44–1.00)
GFR, Estimated: >60 mL/min (ref >60)
Glucose, Bld: 124 mg/dL — ABNORMAL HIGH (ref 70–99)
Potassium: 3.7 mmol/L (ref 3.5–5.1)
Sodium: 137 mmol/L (ref 135–145)

## 2022-10-05 LAB — CBC
HCT: 37.6 % (ref 36.0–46.0)
Hemoglobin: 12.3 g/dL (ref 12.0–15.0)
MCH: 27.3 pg (ref 26.0–34.0)
MCHC: 32.7 g/dL (ref 30.0–36.0)
MCV: 83.6 fL (ref 80.0–100.0)
Platelets: 341 10*3/uL (ref 150–400)
RBC: 4.5 MIL/uL (ref 3.87–5.11)
RDW: 16.4 % — ABNORMAL HIGH (ref 11.5–15.5)
WBC: 10.3 10*3/uL (ref 4.0–10.5)
nRBC: 0 % (ref 0.0–0.2)

## 2022-10-11 ENCOUNTER — Telehealth (HOSPITAL_COMMUNITY): Payer: Self-pay

## 2022-10-11 NOTE — Telephone Encounter (Signed)
Spoke to patient about procedure scheduled for tomorrow. Aware of time,nothing to eat or drink after midnight.Hold torsemide day of procedure. Has transportation to and from procedure.

## 2022-10-12 ENCOUNTER — Encounter (HOSPITAL_COMMUNITY): Payer: Self-pay | Admitting: Internal Medicine

## 2022-10-12 ENCOUNTER — Ambulatory Visit (HOSPITAL_COMMUNITY)
Admission: RE | Admit: 2022-10-12 | Discharge: 2022-10-12 | Disposition: A | Discharge status: Home / Self Care | Payer: Commercial Managed Care - PPO | Attending: Internal Medicine | Admitting: Internal Medicine

## 2022-10-12 ENCOUNTER — Encounter (HOSPITAL_COMMUNITY)
Admission: RE | Disposition: A | Discharge status: Home / Self Care | Payer: Self-pay | Source: Home / Self Care | Attending: Internal Medicine

## 2022-10-12 ENCOUNTER — Other Ambulatory Visit: Payer: Self-pay

## 2022-10-12 DIAGNOSIS — I272 Pulmonary hypertension, unspecified: Secondary | ICD-10-CM | POA: Insufficient documentation

## 2022-10-12 HISTORY — PX: RIGHT HEART CATH: CATH118263

## 2022-10-12 LAB — POCT I-STAT EG7
Acid-Base Excess: 2 mmol/L (ref 0.0–2.0)
Acid-Base Excess: 4 mmol/L — ABNORMAL HIGH (ref 0.0–2.0)
Bicarbonate: 25.8 mmol/L (ref 20.0–28.0)
Bicarbonate: 27.3 mmol/L (ref 20.0–28.0)
Calcium, Ion: 1.07 mmol/L — ABNORMAL LOW (ref 1.15–1.40)
Calcium, Ion: 1.2 mmol/L (ref 1.15–1.40)
HCT: 33 % — ABNORMAL LOW (ref 36.0–46.0)
HCT: 35 % — ABNORMAL LOW (ref 36.0–46.0)
Hemoglobin: 11.2 g/dL — ABNORMAL LOW (ref 12.0–15.0)
Hemoglobin: 11.9 g/dL — ABNORMAL LOW (ref 12.0–15.0)
O2 Saturation: 68 %
O2 Saturation: 72 %
Potassium: 2.6 mmol/L — CL (ref 3.5–5.1)
Potassium: 2.8 mmol/L — ABNORMAL LOW (ref 3.5–5.1)
Sodium: 139 mmol/L (ref 135–145)
Sodium: 142 mmol/L (ref 135–145)
TCO2: 27 mmol/L (ref 22–32)
TCO2: 28 mmol/L (ref 22–32)
pCO2, Ven: 34.6 mmHg — ABNORMAL LOW (ref 44–60)
pCO2, Ven: 34.7 mmHg — ABNORMAL LOW (ref 44–60)
pH, Ven: 7.479 — ABNORMAL HIGH (ref 7.25–7.43)
pH, Ven: 7.506 — ABNORMAL HIGH (ref 7.25–7.43)
pO2, Ven: 33 mmHg (ref 32–45)
pO2, Ven: 34 mmHg (ref 32–45)

## 2022-10-12 LAB — GLUCOSE, CAPILLARY: Glucose-Capillary: 120 mg/dL — ABNORMAL HIGH (ref 70–99)

## 2022-10-12 SURGERY — RIGHT HEART CATH
Anesthesia: LOCAL

## 2022-10-12 MED ORDER — LIDOCAINE HCL (PF) 1 % IJ SOLN
INTRAMUSCULAR | Status: AC
  Filled 2022-10-12: qty 30

## 2022-10-12 MED ORDER — SODIUM CHLORIDE 0.9% FLUSH: 3.0000 mL | INTRAVENOUS | Status: DC | PRN

## 2022-10-12 MED ORDER — SODIUM CHLORIDE 0.9% FLUSH: 3.0000 mL | Freq: Two times a day (BID) | INTRAVENOUS | Status: DC

## 2022-10-12 MED ORDER — HYDRALAZINE HCL 20 MG/ML IJ SOLN: 10.0000 mg | INTRAMUSCULAR | Status: DC | PRN

## 2022-10-12 MED ORDER — ONDANSETRON HCL 4 MG/2ML IJ SOLN: 4.0000 mg | Freq: Four times a day (QID) | INTRAMUSCULAR | Status: DC | PRN

## 2022-10-12 MED ORDER — SODIUM CHLORIDE 0.9 % IV SOLN: 250.0000 mL | INTRAVENOUS | Status: DC | PRN

## 2022-10-12 MED ORDER — ACETAMINOPHEN 325 MG PO TABS: 650.0000 mg | ORAL_TABLET | ORAL | Status: DC | PRN

## 2022-10-12 MED ORDER — HEPARIN (PORCINE) IN NACL 1000-0.9 UT/500ML-% IV SOLN
INTRAVENOUS | Status: DC | PRN
  Administered 2022-10-12 (×2): 500 mL

## 2022-10-12 MED ORDER — SODIUM CHLORIDE 0.9 % IV SOLN: INTRAVENOUS | Status: DC

## 2022-10-12 MED ORDER — LABETALOL HCL 5 MG/ML IV SOLN: 10.0000 mg | INTRAVENOUS | Status: DC | PRN

## 2022-10-12 MED ORDER — LIDOCAINE HCL (PF) 1 % IJ SOLN
INTRAMUSCULAR | Status: DC | PRN
  Administered 2022-10-12 (×2): 5 mL

## 2022-10-12 SURGICAL SUPPLY — 9 items
CATH BALLN WEDGE 5F 110CM (CATHETERS) IMPLANT
CATH SWAN GANZ 7F STRAIGHT (CATHETERS) IMPLANT
GLIDESHEATH SLENDER 7FR .021G (SHEATH) IMPLANT
GUIDEWIRE .025 260CM (WIRE) IMPLANT
KIT MICROPUNCTURE NIT STIFF (SHEATH) IMPLANT
PACK CARDIAC CATHETERIZATION (CUSTOM PROCEDURE TRAY) ×1 IMPLANT
SHEATH PROBE COVER 6X72 (BAG) IMPLANT
TRANSDUCER W/STOPCOCK (MISCELLANEOUS) ×1 IMPLANT
WIRE EMERALD 3MM-J .025X260CM (WIRE) IMPLANT

## 2022-10-12 NOTE — Discharge Instructions (Signed)
Brachial vein Site Care   This sheet gives you information about how to care for yourself after your procedure. Your health care provider may also give you more specific instructions. If you have problems or questions, contact your health care provider. What can I expect after the procedure? After the procedure, it is common to have: Bruising and tenderness at the catheter insertion area. Follow these instructions at home:  Insertion site care Follow instructions from your health care provider about how to take care of your insertion site. Make sure you: Wash your hands with soap and water before you change your bandage (dressing). If soap and water are not available, use hand sanitizer. Remove your dressing as told by your health care provider. In 24 hours Check your insertion site every day for signs of infection. Check for: Redness, swelling, or pain. Pus or a bad smell. Warmth. You may shower 24-48 hours after the procedure. Do not apply powder or lotion to the site. You may drive later today since no medications given.  Activity For 24 hours after the procedure, or as directed by your health care provider: Do not push or pull heavy objects with the affected arm. Do not drive yourself home from the hospital or clinic. You may drive 24 hours after the procedure unless your health care provider tells you not to. Do not lift anything that is heavier than 10 lb (4.5 kg), or the limit that you are told, until your health care provider says that it is safe.  For 24 hours

## 2022-10-12 NOTE — H&P (Signed)
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 Gearldine Bienenstock for RHC due to pulmonary HTN seen on echo.   Had COVID in January followed by bilateral PNA in February requiring hospitalization. In March had severe hypercarbic respiratory failure requiring intubation.   Now on CPAP and doing much better. More active will less SOB. LE edema with improved control.   F/u echo shoed PAH so referred for RHC.    Review of Systems: [y] = yes, [ ]  = no   General: Weight gain [ ] ; Weight loss [ ] ; Anorexia [ ] ; Fatigue [ ] ; Fever [ ] ; Chills [ ] ; Weakness [ ]   Cardiac: Chest pain/pressure [ ] ; Resting SOB [ ] ; Exertional SOB [ y]; Orthopnea [ ] ; Pedal Edema [ ] ; Palpitations [ ] ; Syncope [ ] ; Presyncope [ ] ; Paroxysmal nocturnal dyspnea[ ]   Pulmonary: Cough [ ] ; Wheezing[ ] ; Hemoptysis[ ] ; Sputum [ ] ; Snoring [ ]   GI: Vomiting[ ] ; Dysphagia[ ] ; Melena[ ] ; Hematochezia [ ] ; Heartburn[ ] ; Abdominal pain [ ] ; Constipation [ ] ; Diarrhea [ ] ; BRBPR [ ]   GU: Hematuria[ ] ; Dysuria [ ] ; Nocturia[ ]   Vascular: Pain in legs with walking [ ] ; Pain in feet with lying flat [ ] ; Non-healing sores [ ] ; Stroke [ ] ; TIA [ ] ; Slurred speech [ ] ;  Neuro: Headaches[ ] ; Vertigo[ ] ; Seizures[ ] ; Paresthesias[ ] ;Blurred vision [ ] ; Diplopia [ ] ; Vision changes [ ]   Ortho/Skin: Arthritis [place]ove.[place]ienne ]; Joint pain Cove.[place]ienne ]; Muscle pain [ ] ; Joint swelling [ ] ; Back Pain [ ] ; Rash [ ]   Psych: Depression[ ] ; Anxiety[ ]   Heme: Bleeding problems [ ] ; Clotting disorders [ ] ; Anemia [ ]   Endocrine: Diabetes [ ] ; Thyroid dysfunction[ ]   Home Medications Prior to Admission medications   Medication Sig Start Date End Date Taking? Authorizing Provider  aspirin EC 81 MG tablet Take 81 mg by mouth in the  morning. Swallow whole.   Yes [provider]  cetirizine (ZYRTEC) 10 MG tablet Take 10 mg by mouth at bedtime.   Yes [provider]  Cyanocobalamin (VITAMIN B-12) 5000 MCG TBDP Take 5,000 mcg by mouth in the morning.   Yes [provider]  ferrous sulfate 325 (65 FE) MG tablet Take 325 mg by mouth daily with breakfast.   Yes [provider]  GLUCOSAMINE-CHONDROITIN PO Take 1 tablet by mouth daily.   Yes [provider]  losartan (COZAAR) 50 MG tablet Take 25 mg by mouth every evening. 09/17/22  Yes [provider]  metFORMIN (GLUCOPHAGE) 1000 MG tablet Take 500 mg by mouth in the morning.   Yes [provider]  montelukast (SINGULAIR) 10 MG tablet Take 10 mg by mouth at bedtime. 01/04/16  Yes [provider]  Multiple Vitamins-Minerals (IMMUNE SUPPORT PO) Take 1 capsule by mouth in the morning. Immune Support Booster Supplement with Echinacea, Vitamin C and Zinc 50mg , Vitamin D 5000 IU, Turmeric Curcumin & Ginger, B6, Elderberry   Yes [provider]  omeprazole (PRILOSEC) 40 MG capsule Take 40 mg by mouth daily before breakfast. 08/04/18  Yes [provider]  tamsulosin (FLOMAX) 0.4 MG CAPS capsule Take 0.4 mg by mouth at bedtime. 09/04/22  Yes [provider]  torsemide (  DEMADEX) 20 MG tablet Take 20 mg by mouth in the morning. 09/06/22  Yes [provider]  albuterol (VENTOLIN HFA) 108 (90 Base) MCG/ACT inhaler Inhale 2 puffs into the lungs every 6 (six) hours as needed for wheezing or shortness of breath. 08/07/22   [provider]    Past Medical History: Past Medical History:  Diagnosis Date   Fatigue    GERD (gastroesophageal reflux disease)    Obesity    OSA (obstructive sleep apnea)    Pulmonary HTN (HCC)    Seasonal allergies    Somnolence     Past Surgical History: Past Surgical History:  Procedure Laterality Date   OVARIAN CYST SURGERY     UPPER GASTROINTESTINAL  ENDOSCOPY  01/28/2009   minimal esophagitis - GERD, chronic peptic duodenitis, minimal gastritis    Family History: Family History  Problem Relation Age of Onset   Heart disease Mother    Healthy Mother    Heart disease Father    Other Father        heart transplant 1999   Cardiomyopathy Father    Breast cancer Maternal Grandmother    Stroke Maternal Grandmother    Heart disease Paternal Grandfather    Heart attack Paternal Grandfather    Colon cancer Neg Hx     Social History: Social History   Socioeconomic History   Marital status: Married    Spouse name: Not on file   Number of children: 0   Years of education: Not on file   Highest education level: Not on file  Occupational History   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  Tobacco Use   Smoking status: Never   Smokeless tobacco: Never  Vaping Use   Vaping Use: Never used  Substance and Sexual Activity   Alcohol use: Not Currently    Comment: wine 1 glass ervey 2 months   Drug use: No   Sexual activity: Not on file  Other Topics Concern   Not on file  Social History Narrative   Not on file   Social Determinants of Health   Financial Resource Strain: Not on file  Food Insecurity: Not on file  Transportation Needs: Not on file  Physical Activity: Not on file  Stress: Not on file  Social Connections: Not on file    Allergies:  Allergies  Allergen Reactions   Clindamycin/Lincomycin Rash    rash   Guaifenesin Other (See Comments)    severe headache   Requip [Ropinirole] Other (See Comments)    Extreme dizziness     Objective:    Vital Signs:   Temp:  [98.2 F (36.8 C)] 98.2 F (36.8 C) (04/12 0736) Pulse Rate:  [103] 103 (04/12 0736) Resp:  [18] 18 (04/12 0736) BP: (143)/(74) 143/74 (04/12 0736) SpO2:  [94 %] 94 % (04/12 0736) Weight:  [146.2 kg] 146.2 kg (04/12 0736)    Weight change: Filed Weights   10/12/22 0736  Weight: (!) 146.2 kg    Intake/Output:  No intake or  output data in the 24 hours ending 10/12/22 0836    Physical Exam    General:  Well appearing. No resp difficulty HEENT: normal Neck: supple. JVP ok . Carotids 2+ bilat; no bruits. No lymphadenopathy or thyromegaly appreciated. Cor: PMI nondisplaced. Regular rate & rhythm. No rubs, gallops or murmurs. Lungs: clear minimal crackles in right base  Abdomen: obese soft, nontender, nondistended. No hepatosplenomegaly. No bruits or masses. Good bowel sounds. Extremities: no  cyanosis, clubbing, rash, trace edema Neuro: alert & orientedx3, cranial nerves grossly intact. moves all 4 extremities w/o difficulty. Affect pleasant   Telemetry   Sinus 90s    Labs   Basic Metabolic Panel: Recent Labs  Lab 10/05/22 0945  NA 137  K 3.7  CL 97*  CO2 27  GLUCOSE 124*  BUN 14  CREATININE 0.68  CALCIUM 9.3    Liver Function Tests: No results for input(s): "AST", "ALT", "ALKPHOS", "BILITOT", "PROT", "ALBUMIN" in the last 168 hours. No results for input(s): "LIPASE", "AMYLASE" in the last 168 hours. No results for input(s): "AMMONIA" in the last 168 hours.  CBC: Recent Labs  Lab 10/05/22 0945  WBC 10.3  HGB 12.3  HCT 37.6  MCV 83.6  PLT 341    Cardiac Enzymes: No results for input(s): "CKTOTAL", "CKMB", "CKMBINDEX", "TROPONINI" in the last 168 hours.  BNP: BNP (last 3 results) No results for input(s): "BNP" in the last 8760 hours.  ProBNP (last 3 results) No results for input(s): "PROBNP" in the last 8760 hours.   CBG: Recent Labs  Lab 10/12/22 0733  GLUCAP 120*    Coagulation Studies: No results for input(s): "LABPROT", "INR" in the last 72 hours.   Imaging   No results found.   Medications:     Current Medications:  sodium chloride flush  3 mL Intravenous Q12H    Infusions:  sodium chloride     sodium chloride        Assessment/Plan   1. Dyspnea with PAH on echo - improved with CPAP - plan RHC today to further evaluate  2. OSA - continue  CPAP  3. HTN - Blood pressure well controlled. Continue current regimen.    Length of Stay: 0  Brittany Meres, MD  10/12/2022, 8:36 AM  Advanced Heart Failure Team Pager [phone] (M-F; 7a - 5p)  Please contact [facility] for night-coverage after hours (4p -7a ) and weekends on [email or web address]

## 2022-10-12 NOTE — Interval H&P Note (Signed)
History and Physical Interval Note:  10/12/2022 9:01 AM  Brittany [name]  has presented today for surgery, with the diagnosis of Pulmonary HTN.  The various methods of treatment have been discussed with the patient and family. After consideration of risks, benefits and other options for treatment, the patient has consented to  Procedure(s): RIGHT HEART CATH (N/A) as a surgical intervention.  The patient's history has been reviewed, patient examined, no change in status, stable for surgery.  I have reviewed the patient's chart and labs.  Questions were answered to the patient's satisfaction.     Jovon Winterhalter

## 2022-10-14 LAB — POCT I-STAT EG7
Acid-Base Excess: 2 mmol/L (ref 0.0–2.0)
Bicarbonate: 24.6 mmol/L (ref 20.0–28.0)
Calcium, Ion: 1.06 mmol/L — ABNORMAL LOW (ref 1.15–1.40)
HCT: 34 % — ABNORMAL LOW (ref 36.0–46.0)
Hemoglobin: 11.6 g/dL — ABNORMAL LOW (ref 12.0–15.0)
O2 Saturation: 75 %
Potassium: 2.6 mmol/L — CL (ref 3.5–5.1)
Sodium: 142 mmol/L (ref 135–145)
TCO2: 26 mmol/L (ref 22–32)
pCO2, Ven: 31.5 mmHg — ABNORMAL LOW (ref 44–60)
pH, Ven: 7.501 — ABNORMAL HIGH (ref 7.25–7.43)
pO2, Ven: 36 mmHg (ref 32–45)

## 2024-03-26 DIAGNOSIS — I272 Pulmonary hypertension, unspecified: Secondary | ICD-10-CM | POA: Diagnosis not present
